# Patient Record
Sex: Male | Born: 1995 | Hispanic: Yes | Marital: Married | Smoking: Current some day smoker
Health system: Southern US, Community
[De-identification: ages and names within clinical notes are randomized; demographics above are authoritative.]

## PROBLEM LIST (undated history)

## (undated) DIAGNOSIS — Z973 Presence of spectacles and contact lenses: Secondary | ICD-10-CM

## (undated) DIAGNOSIS — Z789 Other specified health status: Secondary | ICD-10-CM

## (undated) DIAGNOSIS — J45909 Unspecified asthma, uncomplicated: Secondary | ICD-10-CM

## (undated) DIAGNOSIS — R002 Palpitations: Secondary | ICD-10-CM

## (undated) DIAGNOSIS — U071 COVID-19: Secondary | ICD-10-CM

## (undated) DIAGNOSIS — I10 Essential (primary) hypertension: Secondary | ICD-10-CM

## (undated) DIAGNOSIS — Z8489 Family history of other specified conditions: Secondary | ICD-10-CM

## (undated) DIAGNOSIS — R519 Headache, unspecified: Secondary | ICD-10-CM

## (undated) DIAGNOSIS — F909 Attention-deficit hyperactivity disorder, unspecified type: Secondary | ICD-10-CM

## (undated) DIAGNOSIS — Z0282 Encounter for adoption services: Secondary | ICD-10-CM

## (undated) DIAGNOSIS — F419 Anxiety disorder, unspecified: Secondary | ICD-10-CM

## (undated) DIAGNOSIS — E785 Hyperlipidemia, unspecified: Secondary | ICD-10-CM

## (undated) DIAGNOSIS — F32A Depression, unspecified: Secondary | ICD-10-CM

## (undated) DIAGNOSIS — N200 Calculus of kidney: Secondary | ICD-10-CM

## (undated) DIAGNOSIS — Z87442 Personal history of urinary calculi: Secondary | ICD-10-CM

## (undated) DIAGNOSIS — F319 Bipolar disorder, unspecified: Secondary | ICD-10-CM

## (undated) DIAGNOSIS — K219 Gastro-esophageal reflux disease without esophagitis: Secondary | ICD-10-CM

## (undated) HISTORY — DX: Essential (primary) hypertension: I10

## (undated) HISTORY — PX: LITHOTRIPSY: SUR834

## (undated) HISTORY — DX: Hyperlipidemia, unspecified: E78.5

## (undated) HISTORY — PX: WISDOM TOOTH EXTRACTION: SHX21

---
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
## 2014-04-26 ENCOUNTER — Emergency Department: Payer: Self-pay | Admitting: Emergency Medicine

## 2014-11-01 ENCOUNTER — Inpatient Hospital Stay: Payer: Self-pay | Admitting: Internal Medicine

## 2014-11-01 ENCOUNTER — Ambulatory Visit: Payer: Self-pay | Admitting: Urology

## 2014-11-01 LAB — URINALYSIS, COMPLETE
BACTERIA: NONE SEEN
BILIRUBIN, UR: NEGATIVE
Glucose,UR: NEGATIVE mg/dL (ref 0–75)
Leukocyte Esterase: NEGATIVE
Nitrite: NEGATIVE
Ph: 7 (ref 4.5–8.0)
Protein: 100
RBC,UR: 1529 /HPF (ref 0–5)
SPECIFIC GRAVITY: 1.019 (ref 1.003–1.030)
Squamous Epithelial: NONE SEEN
WBC UR: 6 /HPF (ref 0–5)

## 2014-11-01 LAB — COMPREHENSIVE METABOLIC PANEL
Albumin: 3.9 g/dL (ref 3.8–5.6)
Alkaline Phosphatase: 95 U/L
Anion Gap: 7 (ref 7–16)
BILIRUBIN TOTAL: 0.7 mg/dL (ref 0.2–1.0)
BUN: 11 mg/dL (ref 7–18)
CREATININE: 1.14 mg/dL (ref 0.60–1.30)
Calcium, Total: 8.7 mg/dL — ABNORMAL LOW (ref 9.0–10.7)
Chloride: 107 mmol/L (ref 98–107)
Co2: 27 mmol/L (ref 21–32)
Glucose: 99 mg/dL (ref 65–99)
Osmolality: 281 (ref 275–301)
Potassium: 3.4 mmol/L — ABNORMAL LOW (ref 3.5–5.1)
SGOT(AST): 29 U/L (ref 10–41)
SGPT (ALT): 39 U/L
Sodium: 141 mmol/L (ref 136–145)
Total Protein: 7.1 g/dL (ref 6.4–8.6)

## 2014-11-01 LAB — LIPASE, BLOOD: LIPASE: 82 U/L (ref 73–393)

## 2014-11-01 LAB — CBC
HCT: 42.4 % (ref 40.0–52.0)
HGB: 14.9 g/dL (ref 13.0–18.0)
MCH: 31.8 pg (ref 26.0–34.0)
MCHC: 35.1 g/dL (ref 32.0–36.0)
MCV: 91 fL (ref 80–100)
PLATELETS: 283 10*3/uL (ref 150–440)
RBC: 4.69 10*6/uL (ref 4.40–5.90)
RDW: 13.5 % (ref 11.5–14.5)
WBC: 9.7 10*3/uL (ref 3.8–10.6)

## 2014-11-01 LAB — MONONUCLEOSIS SCREEN: Mono Test: NEGATIVE

## 2014-11-01 LAB — ETHANOL

## 2014-11-03 LAB — CBC WITH DIFFERENTIAL/PLATELET
BASOS ABS: 0.1 10*3/uL (ref 0.0–0.1)
Basophil %: 0.5 %
EOS PCT: 1.5 %
Eosinophil #: 0.2 10*3/uL (ref 0.0–0.7)
HCT: 37.9 % — AB (ref 40.0–52.0)
HGB: 13 g/dL (ref 13.0–18.0)
LYMPHS ABS: 3.2 10*3/uL (ref 1.0–3.6)
Lymphocyte %: 29.1 %
MCH: 32.1 pg (ref 26.0–34.0)
MCHC: 34.3 g/dL (ref 32.0–36.0)
MCV: 94 fL (ref 80–100)
Monocyte #: 1.1 x10 3/mm — ABNORMAL HIGH (ref 0.2–1.0)
Monocyte %: 9.9 %
Neutrophil #: 6.5 10*3/uL (ref 1.4–6.5)
Neutrophil %: 59 %
Platelet: 254 10*3/uL (ref 150–440)
RBC: 4.05 10*6/uL — AB (ref 4.40–5.90)
RDW: 13.5 % (ref 11.5–14.5)
WBC: 11 10*3/uL — ABNORMAL HIGH (ref 3.8–10.6)

## 2014-11-03 LAB — BASIC METABOLIC PANEL
Anion Gap: 5 — ABNORMAL LOW (ref 7–16)
BUN: 4 mg/dL — AB (ref 7–18)
CALCIUM: 7.6 mg/dL — AB (ref 9.0–10.7)
CHLORIDE: 104 mmol/L (ref 98–107)
Co2: 32 mmol/L (ref 21–32)
Creatinine: 1.12 mg/dL (ref 0.60–1.30)
EGFR (Non-African Amer.): >60
Glucose: 78 mg/dL (ref 65–99)
Osmolality: 277 (ref 275–301)
POTASSIUM: 3.4 mmol/L — AB (ref 3.5–5.1)
Sodium: 141 mmol/L (ref 136–145)

## 2014-11-03 LAB — URINALYSIS, COMPLETE
Bilirubin,UR: NEGATIVE
GLUCOSE, UR: NEGATIVE mg/dL (ref 0–75)
KETONE: NEGATIVE
NITRITE: NEGATIVE
PH: 6 (ref 4.5–8.0)
Protein: 30
RBC,UR: 1079 /HPF (ref 0–5)
SPECIFIC GRAVITY: 1.005 (ref 1.003–1.030)
SQUAMOUS EPITHELIAL: NONE SEEN
WBC UR: 9 /HPF (ref 0–5)

## 2014-11-03 LAB — URINE CULTURE

## 2014-11-12 ENCOUNTER — Ambulatory Visit: Payer: Self-pay | Admitting: Urology

## 2014-11-24 ENCOUNTER — Ambulatory Visit: Payer: Self-pay | Admitting: Urology

## 2015-04-17 NOTE — Consult Note (Signed)
Urology Consultation Report: for Consultation:  Obstructing Left Ureteral Stone with Refractory Colic MD: Auburn BilberryShreyang Patel, M.D.MD: Marin OlpJay H. Jadian Karman, M.D. Barbette ReichmannVishwanath Hande, M.D. 19 y.o. WM without prior h/o Urolithiasis and a 4 day h/o progressive left abd pain, associated with nausea, but no emesis.  Pt [facility] ER yesterday.  Abd US revealed a 9mm stone in the proximal left ureteral with mild hydro. A 2nd 3mm stone was also identified in the LLP.  KUB did not reveal any radio-opaque densities, however, a large amount of bowel gas is present.  Denies dysuria, gross hematuria, F/C, diarrhea/constipation. WBC wnL, Ct 1.14, Ca++ 8.7, UA (yellow/turbid) pH 7.0, SG 1.019, 1529 rbc/hpf, 6 wbc/hpf, no bacteria. as per the detailed H&P by Dr. Lacie ScottsShreyang H. Patel, dated 11/01/2014. T (97.35F), P (109), RR 20, BP (144/91), O2 Sat (99% on RA)WDWN WM in NADWarm/Dry, no lesions about the head/neckNC/AT, EOMI, Anictericno masses, no bruitsCTA, nL Respiratory EffortRRR (mild tachycardia) without M/G/R, 2+ pulses b/Ltender b/L lower quadrants without guarding/rebound, no CVAT, no palpable masses/organomegalydeferredno edemaNon-focalSleepy, but arousable and appropriate, cooperative. Oriented x4. per HPI  left proximal ureteral stone - ?non-radio-opaque, large (unlikely to pass spontaneously), with refractory colic despite parenteral narcotics  Cysto with Left JJ Ureteral Stent placementreassess whether the stone is radio-opaque intra-op with fluoroscopyunderstand that multiple, staged procedures may be required to clear the large stone burdenunderstand that the stone may be impacted preventing retrograde access, thereby necessitating percutaneous nephrostomy placementunderstand that follow-up and definitive stone treatment will be [facility]  Electronic Signatures: Marin OlpKim, Sharvi Mooneyhan H (MD)  (Signed on 08-Nov-15 14:29)  Authored  Last Updated: 08-Nov-15 14:29 by Marin OlpKim, Destin Kittler H (MD)

## 2015-04-17 NOTE — Discharge Summary (Signed)
PATIENT NAME:  Tom Branch MR#:  [id number] DATE OF BIRTH:  [birth year]  DATE OF ADMISSION:  11/01/2014 DATE OF DISCHARGE:  11/03/2014  DIAGNOSES AT TIME OF DISCHARGE:  1.  Left ureteral lithiasis with bladder spasm and pyelonephrosis.   2.  Temporary left ureteral catheterization with left JJ ureteral stent placement.   CHIEF COMPLAINT: Left lower quadrant abdominal pain.   HISTORY OF PRESENT ILLNESS: Tom Branch is a 19 year old male with a history of renal stones and ADHD who presented to the ED complaining of left upper quadrant abdominal pain, which he described as sharp in nature. The patient stated that subsequently the pain moved to the left lower quadrant area and also the back. An ultrasound of the abdomen showed a 9 mm obstructing stone within the proximal left ureter with secondary mild left hydronephrosis. The patient was seen initially by urologist Dr. Maudie Mercury and underwent cystoscopy with temporary left ureteral catheterization and selective urine collection from the left renal collecting system and left JJ ureteral stent placement.    PAST MEDICAL HISTORY:  Significant for ADHD, environmental and food allergies.  Please see H and P for other details.   HOSPITAL COURSE: The patient was in significant pain and received intravenous morphine and sometimes Dilaudid. He was continued on his Concerta. He was also seen in consultation subsequently by Dr. Yves Dill who felt that the patient should be well hydrated and was prescribed Levsin for bladder spasm and Nucynta for pain, stool softeners also prescribed and the patient will follow up with Dr. Yves Dill as an outpatient. The patient was discharged on the following medications.    DISCHARGE MEDICATIONS: Cipro 500 mg p.o. b.i.d. for 10 days, docusate sodium 200 mg p.o. b.i.d., Levsin sublingual 2 tablets q. 4 hours p.r.n. for bladder spasm, ondansetron 8 mg disintegrating every 6 hours p.r.n. for nausea and vomiting, methylphenidate 36 mg in 24  hours extended release 2 tablets once a day, and Adrenaclick 2 pack 0.3 mg injectable kit as directed.   DISCHARGE INSTRUCTIONS:  The patient will follow up with Dr. Yves Dill as an outpatient and also with me, Dr. Ginette Pitman, in 2-3 weeks time. He has been advised to call back with any questions or concerns. He was stable at the time of discharge.   TOTAL TIME SPENT DISCHARGING THIS PATIENT: 35 minutes.     ____________________________ Tracie Harrier, MD vh:bu D: 11/04/2014 12:49:14 ET T: 11/04/2014 13:17:42 ET JOB#: [id number]  cc: Tracie Harrier, MD, <Dictator> Tracie Harrier MD ELECTRONICALLY SIGNED 11/24/2014 17:47

## 2015-04-17 NOTE — Op Note (Signed)
PATIENT NAME:  Tom Branch, Tom Branch MR#:  [id number] DATE OF BIRTH:  [birth year]  DATE OF PROCEDURE:  11/24/2014  PREOPERATIVE DIAGNOSIS: Ureterolithiasis.   POSTOPERATIVE DIAGNOSIS: Ureterolithiasis.   PROCEDURES: Cystoscopy with stent removal.   SURGEON: Anola GurneyMichael Mostyn Varnell, M.D.   ANESTHETIST: Yves DillPaul Carroll, MD, Suszanne ConnersMichael R. Evelene CroonWolff, MD.   ANESTHETIC METHOD: General per Dr. Noralyn Pickarroll, local per Dr. Evelene CroonWolff.   INDICATIONS: See the dictated history and physical. After informed consent, the patient requests the above procedure.   OPERATIVE SUMMARY: After adequate general anesthesia had been obtained, the patient was placed into dorsal lithotomy position and the perineum was prepped and draped in the usual fashion. The 21 French cystoscope was coupled with the camera and then visually advanced into the bladder. The left ureteral stent was identified. Right orifice was identified and had clear efflux. No bladder mucosal lesions were identified. At this point, the alligator graspers advanced through the scope and the stent engaged and removed. The cystoscope was removed as well, 10 mL of viscous Xylocaine was instilled within the urethra and the bladder. AB and O suppository was placed. The procedure was then terminated and patient was transferred to the recovery room in stable condition.    ____________________________ Suszanne ConnersMichael R. Evelene CroonWolff, MD mrw:DT D: 11/24/2014 13:29:07 ET T: 11/24/2014 14:46:53 ET JOB#: [id number]  cc: Suszanne ConnersMichael R. Evelene CroonWolff, MD, <Dictator> Orson ApeMICHAEL R Myndi Wamble MD ELECTRONICALLY SIGNED 11/26/2014 9:28

## 2015-04-17 NOTE — Consult Note (Signed)
Brief Consult Note: Diagnosis: Left ureterolithiasis. Bladder spasms due to stent. Pyonephrosis.   Patient was seen by consultant.   Consult note dictated.   Recommend to proceed with surgery or procedure.   Recommend further assessment or treatment.   Orders entered.   Discussed with Attending MD.   Comments: Keep well hydrated. Levsin for spasms. Nucynta for pain. Stool softeners. Stable from urological poit for discharge. Follow-up in the office on monday and I will scdule for lithotripsy next thursday.  Electronic Signatures: Orson ApeWolff, Michael R (MD)  (Signed [phone]-Nov-15 12:08)  Authored: Brief Consult Note   Last Updated: 10-Nov-15 12:08 by Orson ApeWolff, Michael R (MD)

## 2015-04-17 NOTE — Consult Note (Signed)
PATIENT NAME:  Tom Branch, Tom Branch MR#:  [id number] DATE OF BIRTH:  [birth year]  DATE OF CONSULTATION:  11/03/2014  REFERRING PHYSICIAN:   CONSULTING PHYSICIAN:  Suszanne ConnersMichael R. Evelene CroonWolff, MD  REQUESTING PHYSICIAN:  Dr. Marcello FennelHande.    REASON FOR CONSULTATION: Kidney stone pain.   HISTORY OF PRESENT ILLNESS: Tom Branch is a 19 year old Caucasian male with a left proximal ureteral stone who underwent stent placement per Dr. Selena BattenKim on 11/08. Apparently he continues to have bladder spasms and flank pain. He is currently on Cipro for pyelonephrosis. This is his first stone episode.   ALLERGIES: NO DRUG ALLERGIES. HE HAS MULTIPLE ENVIRONMENTAL AND FOOD ALLERGIES INCLUDING MEAT, GRASS, AND DUST.   CHRONIC HOME MEDICATIONS: Included hydroxyzine, hydrocortisone cream, Concerta, Adrenaclick for allergy attacks.    PAST SURGICAL HISTORY: No previous surgical procedures.   SOCIAL HISTORY: The patient denied tobacco or alcohol use.   FAMILY HISTORY: Is not available because he is adopted.   PAST AND CURRENT MEDICAL CONDITIONS:  1.  ADHD.   2.  Environmental allergies.   REVIEW OF SYSTEMS: The patient denied prior history of kidney stones, hematuria, or urinary tract infections.   PHYSICAL EXAMINATION:  GENERAL: A well-nourished white male in no acute distress.  HEENT: Sclerae were clear. Pupils were equally round, reactive to light and accommodation.  NECK: Supple. No palpable cervical adenopathy.  LUNGS: Clear to auscultation.  CARDIOVASCULAR: Regular rhythm and rate without audible murmurs.  ABDOMEN: Soft, nontender abdomen.  GENITOURINARY AND RECTAL: Deferred.  NEUROMUSCULAR: Grossly intact. The patient was alert and oriented x 3.   KUB today indicated that the stent was in good position. He had a large amount of overlying bowel gas and stool and stone was not clearly seen.   PERTINENT LABORATORY STUDIES: Include hematocrit 37.9% with a white cell count of 11,000. BUN was 4 and creatinine 1.12.    IMPRESSION:  1.  Left ureterolithiasis status post stent placement.  2.  Pyelonephrosis status post stent placement.  3.  Bladder spasms and renal colic.   PLAN:  1.  Encouraged the patient drink plenty of fluids as this would certainly help his bladder spasms.   2.  Levsin sublingual 2 every 4 hours as needed for spasm.  3.  Nucynta 50-100 mg every 6 hours need for pain.   4.  Zofran 8 mg sublingual every 4-6 hours need for nausea and vomiting.  5.  Complete 10 day course of Cipro and then follow up in the office next week. We will schedule lithotripsy after he has completed the course of antibiotics.   6.  Colace 200 mg b.i.d.    ____________________________ Suszanne ConnersMichael R. Evelene CroonWolff, MD mrw:bu D: 11/03/2014 13:05:01 ET T: 11/03/2014 13:23:43 ET JOB#: [id number]  cc: Suszanne ConnersMichael R. Evelene CroonWolff, MD, <Dictator> Orson ApeMICHAEL R Douglas Smolinsky MD ELECTRONICALLY SIGNED 11/03/2014 15:25

## 2015-04-17 NOTE — H&P (Signed)
PATIENT NAME:  Tom Branch, Tom Branch MR#:  [id number] DATE OF BIRTH:  February 25, 1996  DATE OF ADMISSION:  11/01/2014  PRIMARY CARE PROVIDER: Tracie Harrier, M.D.   EMERGENCY DEPARTMENT REFERRING PHYSICIAN: Gretchen Short. Beather Arbour, M.D.   REASON FOR ADMISSION: Left lower quadrant abdominal pain.   HISTORY OF PRESENT ILLNESS: The patient is a 19 year old, white male, with no history of renal stones in the past. Has a history of ADHD, environmental allergies, who started having left upper quadrant pain, sharp in nature, last Wednesday. The pain persisted for a few hours and then went away, and then since yesterday, he is having severe pain. The patient came to the ED with sharp pain in the left lower quadrant and also in the back of his abdomen. The patient underwent evaluation, including ultrasound of the abdomen which showed a 9 mm obstructive stone within the proximal left ureter with secondary mild left hydronephrosis was noted. Dr. Lurline Hare discussed the case with Dr. Maudie Mercury of urology, who will see the patient. The patient, at this point, is still having pain and is having some nausea. Denies any chest pains, palpitations, no fevers, no chills, no difficulty with urination.   PAST MEDICAL HISTORY: Significant for history of ADHD, MULTIPLE ENVIRONMENTAL AND FOOD ALLERGIES.   ALLERGIES: RED MEAT, GRASS, DUST.   MEDICATIONS AT HOME: He is on hydroxyzine 25 mg 1 tablet 4 times a day as needed, hydrocortisone 1% topically applied to affected area as needed, EpiPen as needed, Concerta 36 mg 2 tablets daily, Adrenaclick kit as needed.   SOCIAL HISTORY: Does not smoke. Does not drink, no drugs.   FAMILY HISTORY: He is adopted and does not know about his original family.   REVIEW OF SYSTEMS:  CONSTITUTIONAL: Denies any fevers, chills. No weight loss. No weight gain.  HEENT: Denies any blurred vision, double vision. No cataracts, no glaucoma.  ENT: Denies any nasal congestion. Does have environmental allergies, denies  any epistaxis. No difficulty with swallowing. Ears: Denies any ringing in the ears. No difficulty with hearing.  CARDIOVASCULAR: Denies any chest pain, palpitations, syncope.  PULMONARY: Denies any cough, wheezing, hemoptysis. No COPD.  GASTROINTESTINAL: Complains of left lower quadrant abdominal pain. No nausea, vomiting or diarrhea. No hematemesis. No hematochezia.  GENITOURINARY: Denies any frequency, urgency or< hematuria.  ENDOCRINE: Denies any polyuria or polydipsia. No diabetes.  SKIN: Denies any rash, denies any lymph node enlargement.  MUSCULOSKELETAL: Denies any joint pains or gout.  NEUROLOGIC: No CVA, TIA or seizures. Does have ADHD.  PSYCHIATRIC: Does have ADHD. No anxiety or depression.   PHYSICAL EXAMINATION:  VITAL SIGNS: Temperature 97.5, pulse 88, respirations 20, blood pressure 140/84.  GENERAL: The patient is a well-developed, well-nourished male in no acute distress.  HEENT: Head atraumatic, normocephalic. Pupils equally round, reactive to light and accommodation. There is no conjunctival pallor. No scleral icterus. Nasal examination shows no drainage or ulceration. Ears: There is no erythema or drainage. Oropharynx: Dry without any exudate.  NECK: Supple without any JVD. No thyromegaly.  CARDIOVASCULAR: Regular rate and rhythm. No murmurs, rubs, clicks, or gallops.  LUNGS: Clear to auscultation bilaterally without any rales, rhonchi, wheezing.  ABDOMEN: There is a left lower quadrant tenderness. No guarding, no rebound.  EXTREMITIES: No clubbing, cyanosis, or edema.  SKIN: No rash.  LYMPH NODES: Nonpalpable.  VASCULAR: Good DP, PT pulses.  PSYCHIATRIC: Not anxious or depressed.  NEUROLOGIC: Awake, alert, oriented x 3. No focal deficits.  MUSCULOSKELETAL: There is no erythema or swelling.  LYMPHATICS: Lymph nodes nonpalpable.  EVALUATIONS: Ultrasound of the abdomen shows a 9 mm obstructive stone within the proximal left ureter with secondary mild left hydronephrosis.  Additional no-obstructive stone measuring up to 3 mm within the lower pole of the left kidney.   PA and lateral chest x-ray shows no acute cardiopulmonary processes.   KUB was negative.   LABORATORY DATA: Glucose 99, BUN 11, creatinine 1.14, sodium 141, potassium 3.4, chloride 107, CO2 of 27. LFTs are normal. WBC 9.7, hemoglobin 14.9, platelet count 283,000, lipase 82. Mono test negative.   ASSESSMENT AND PLAN: 1. The patient is a 19 year old, white male, with history of attention deficit/hyperactivity disorder, presents with severe left lower quadrant pain,left lower quadrant abdominal pain due to obstructive stone. At this time, we will give him IV fluids. We will control his pain with morphine. The Emergency Department M.D. has spoken to Dr. Maudie Mercury of urology, who will see the patient. We will place him on empiric antibiotics. Further intervention per urology. 2. Attention deficit/hyperactivity disorder. We will continue Concerta.  3. Miscellaneous. The patient will be on sequential compression devices for deep vein thrombosis prophylaxis in light of the possible need for a procedure.   TIME SPENT: 45 minutes.     ____________________________ Lafonda Mosses Posey Pronto, MD shp:JT D: 11/01/2014 08:39:24 ET T: 11/01/2014 10:20:07 ET JOB#: [id number]  cc: Kolette Vey H. Posey Pronto, MD, <Dictator> Alric Seton MD ELECTRONICALLY SIGNED 11/11/2014 19:35

## 2015-04-17 NOTE — H&P (Signed)
PATIENT NAME:  Tom Branch, [name] MR#:  [id number] DATE OF BIRTH:  1996-07-07  DATE OF ADMISSION:  11/24/2014  Same day surgery scheduled December 1.   CHIEF COMPLAINT: Kidney stone.   HISTORY OF PRESENT ILLNESS: Mr. Tom Branch is a 19 year old Caucasian male with a left proximal ureteral stone, who underwent stent placement November 8 followed by lithotripsy  November 19. Followup in the office on November 25 indicated that he was stone free. He comes in now for cystoscopy with stent retrieval.   ALLERGIES: No drug allergies.   CURRENT MEDICATIONS: Include hydroxyzine, hydrocortisone cream, Concerta, Adrenaclick for allergic attacks.   PAST SURGICAL HISTORY: No previous surgical procedures other than the above.   SOCIAL HISTORY: The patient denied tobacco or alcohol use.   FAMILY HISTORY: Not available because he is adopted.   PAST AND CURRENT MEDICAL CONDITIONS:  1.  ADHD.  2.  Environmental allergies.   REVIEW OF SYSTEMS: The patient denied urinary tract infections or prior history of stone disease.   PHYSICAL EXAMINATION: GENERAL: A well-nourished white male in no distress.  HEENT: Sclerae were clear. Pupils were equally round and reactive to light and accommodation. Extraocular movements were intact.  NECK: Supple. No palpable cervical adenopathy.  LUNGS: Clear to auscultation.  CARDIOVASCULAR: Regular rhythm and rate without audible murmurs.  ABDOMEN: Soft, nontender abdomen.  GENITOURINARY AND RECTAL: Deferred.  NEUROMUSCULAR: Grossly intact.   IMPRESSION: Right ureterolithiasis status post stent placement and lithotripsy.   PLAN: Cystoscopy with stent retrieval .    ____________________________ Suszanne ConnersMichael R. Evelene CroonWolff, MD mrw:at D: 11/18/2014 14:48:37 ET T: 11/18/2014 15:40:58 ET JOB#: [id number]  cc: Suszanne ConnersMichael R. Evelene CroonWolff, MD, <Dictator> Orson ApeMICHAEL R Meria Crilly MD ELECTRONICALLY SIGNED 11/24/2014 8:58

## 2015-04-21 NOTE — Op Note (Signed)
PATIENT NAME:  Tom Branch, Tom Branch MR#:  [id number] DATE OF BIRTH:  03/12/1996  DATE OF PROCEDURE:  11/01/2014  PREOPERATIVE DIAGNOSIS: Left obstructing proximal ureteral calculus.  POSTOPERATIVE DIAGNOSES:  1.  Left obstructing proximal ureteral calculus. 2.  Left pyonephrosis.   PROCEDURES: 1.  Cystourethroscopy with temporary left ureteral catheterization.  2.  Selective urine collection from the left renal collecting system.  3.  Left double-J ureteral stent placement.   SURGEON: Marin OlpJay H. Sherle Mello, MD   ANESTHESIA:  General.  ESTIMATED BLOOD LOSS: None.   COMPLICATIONS: None.   DRAINS: A 4.8 French x 26 cm double-J ureteral stent on the left (no string).   PATHOLOGY SPECIMENS: Urine from the left renal collecting system for Gram stain and routine cultures.   DESCRIPTION OF PROCEDURE: The patient was brought to the cystoscopy suite and after general anesthesia was established, was placed in the dorsal lithotomy position and prepped and draped in the usual sterile manner. A 21 French cystoscope sheath was then inserted with the 30 degrees lens. Urethra was smooth without lesion or abnormality. The prostate was nonocclusive. Within the bladder, there were single ureteral orifices bilaterally. There were no intravesical stones. There were no bladder diverticula. There were no bladder mucosal lesions or tumors.   A 0.035 angled tip Sensor wire was then inserted into the left ureteral orifice and advanced into the region of the left renal collecting system under fluoroscopic guidance. There were no radiopaque densities appreciated overlying the course of the left kidney or ureter. However, there was a large amount of bowel gas appreciated.   After passage of the guidewire, copious amounts of cloudy, purulent, blood-tinged urine was appreciated consistent with the release of a pyonephrosis. As such, a 5 [place]French open-ended ureteral catheter was advanced over the Sensor wire and into the region of the  left renal pelvis under fluoroscopic guidance. The Sensor wire was then withdrawn and a good hydronephrotic drip appreciated. Urine was collected from the left renal collecting system through the open-ended ureteral catheter and was sent for Gram stain with routine cultures and sensitivities.   The glidewire was then replaced through the open-ended ureteral catheter into the left renal collecting system. The open-ended ureteral catheter was then withdrawn. A 4.8 French x 26 cm double-J ureteral stent was then advanced over the Sensor wire and once a good curl was obtained in the renal pelvis, the guidewire was removed producing a good curl in the bladder. Excellent efflux of cloudy purulent urine was noted through and around the ureteral stent after placement. This was consistent with, again, good decompression of the pyonephrosis. The string had been removed from the distal aspect of the ureteral stent prior to placement.   The bladder was then drained through the cystoscope sheath, which was then withdrawn. The patient was then taken out of the dorsal lithotomy position and extubated and transferred to the postanesthesia care unit in stable condition having tolerated the procedure well.    ____________________________ Marin OlpJay H. Godfrey Tritschler, MD jhk:LT D: 11/01/2014 19:49:16 ET T: 11/01/2014 20:18:03 ET JOB#: [id number]  cc: Marin OlpJay H. Khari Lett, MD, <Dictator> Marin OlpJAY H Tomeka Kantner MD ELECTRONICALLY SIGNED 12/30/2014 1:32

## 2015-04-28 DIAGNOSIS — J45909 Unspecified asthma, uncomplicated: Secondary | ICD-10-CM | POA: Insufficient documentation

## 2015-09-15 ENCOUNTER — Encounter: Payer: Self-pay | Admitting: Emergency Medicine

## 2015-09-15 ENCOUNTER — Emergency Department: Payer: Worker's Compensation

## 2015-09-15 ENCOUNTER — Emergency Department
Admission: EM | Admit: 2015-09-15 | Discharge: 2015-09-15 | Disposition: A | Discharge status: Home / Self Care | Payer: Worker's Compensation | Attending: Emergency Medicine | Admitting: Emergency Medicine

## 2015-09-15 DIAGNOSIS — Y9289 Other specified places as the place of occurrence of the external cause: Secondary | ICD-10-CM | POA: Insufficient documentation

## 2015-09-15 DIAGNOSIS — S6722XA Crushing injury of left hand, initial encounter: Secondary | ICD-10-CM

## 2015-09-15 DIAGNOSIS — Y990 Civilian activity done for income or pay: Secondary | ICD-10-CM | POA: Diagnosis not present

## 2015-09-15 DIAGNOSIS — W231XXA Caught, crushed, jammed, or pinched between stationary objects, initial encounter: Secondary | ICD-10-CM | POA: Diagnosis not present

## 2015-09-15 DIAGNOSIS — S67193A Crushing injury of left middle finger, initial encounter: Secondary | ICD-10-CM | POA: Insufficient documentation

## 2015-09-15 DIAGNOSIS — S61303A Unspecified open wound of left middle finger with damage to nail, initial encounter: Secondary | ICD-10-CM | POA: Insufficient documentation

## 2015-09-15 DIAGNOSIS — Y9389 Activity, other specified: Secondary | ICD-10-CM | POA: Diagnosis not present

## 2015-09-15 DIAGNOSIS — Z79899 Other long term (current) drug therapy: Secondary | ICD-10-CM | POA: Insufficient documentation

## 2015-09-15 DIAGNOSIS — S61309A Unspecified open wound of unspecified finger with damage to nail, initial encounter: Secondary | ICD-10-CM

## 2015-09-15 DIAGNOSIS — S61211A Laceration without foreign body of left index finger without damage to nail, initial encounter: Secondary | ICD-10-CM | POA: Diagnosis present

## 2015-09-15 MED ORDER — ACETAMINOPHEN-CODEINE #3 300-30 MG PO TABS
1.0000 | ORAL_TABLET | Freq: Four times a day (QID) | ORAL | Status: DC | PRN
Start: 2015-09-15 — End: 2016-12-08

## 2015-09-15 MED ORDER — LIDOCAINE HCL (PF) 1 % IJ SOLN
5.0000 mL | Freq: Once | INTRAMUSCULAR | Status: AC
  Administered 2015-09-15: 5 mL
  Filled 2015-09-15: qty 5

## 2015-09-15 NOTE — ED Notes (Signed)
Pt presents to ed with laceration to left index finger, happened at work this am.

## 2015-09-15 NOTE — ED Provider Notes (Signed)
[facility] Emergency Department Provider Note ____________________________________________  Time seen: 1225  I have reviewed the triage vital signs and the nursing notes.  HISTORY  Chief Complaint  Laceration  HPI Tom Branch is a 19 y.o. male reports to the ED for evaluation of an injury to his left index finger while at work today. He describes his left index finger got caught between the pin of a hydraulic lift it piece of equipment. This caused disruption of his left index fingernail from the base. He presents with a partial nail avulsion. He denies any other injury at this time. He reports an up-to-date tetanus status.  History reviewed. No pertinent past medical history.  There are no active problems to display for this patient.  History reviewed. No pertinent past surgical history.  Current Outpatient Rx  Name  Route  Sig  Dispense  Refill  . methylphenidate 36 MG PO CR tablet   Oral   Take 36 mg by mouth daily.         Marland Kitchen acetaminophen-codeine (TYLENOL #3) 300-30 MG per tablet   Oral   Take 1 tablet by mouth every 6 (six) hours as needed for moderate pain.   10 tablet   0    Allergies Review of patient's allergies indicates no known allergies.  No family history on file.  Social History Social History  Substance Use Topics  . Smoking status: Never Smoker   . Smokeless tobacco: None  . Alcohol Use: No   Review of Systems  Constitutional: Negative for fever. Eyes: Negative for visual changes. ENT: Negative for sore throat. Cardiovascular: Negative for chest pain. Respiratory: Negative for shortness of breath. Gastrointestinal: Negative for abdominal pain, vomiting and diarrhea. Genitourinary: Negative for dysuria. Musculoskeletal: Negative for back pain. Left index finger nail injury. Skin: Negative for rash. Neurological: Negative for headaches, focal weakness or  numbness. ____________________________________________  PHYSICAL EXAM:  VITAL SIGNS: ED Triage Vitals  Enc Vitals Group     BP 09/15/15 1147 131/68 mmHg     Pulse Rate 09/15/15 1147 71     Resp 09/15/15 1147 20     Temp 09/15/15 1147 98.2 F (36.8 C)     Temp Source 09/15/15 1147 Oral     SpO2 09/15/15 1147 100 %     Weight 09/15/15 1147 147 lb (66.679 kg)     Height 09/15/15 1147  (1.702 m)     Head Cir --      Peak Flow --      Pain Score 09/15/15 1148 10     Pain Loc --      Pain Edu? --      Excl. in GC? --    Constitutional: Alert and oriented. Well appearing and in no distress. Eyes: Conjunctivae are normal. PERRL. Normal extraocular movements. ENT   Head: Normocephalic and atraumatic.   Nose: No congestion/rhinorrhea.   Mouth/Throat: Mucous membranes are moist.   Neck: Supple. No thyromegaly. Hematological/Lymphatic/Immunological: No cervical lymphadenopathy. Cardiovascular: Normal rate, regular rhythm.  Respiratory: Normal respiratory effort. No wheezes/rales/rhonchi. Gastrointestinal: Soft and nontender. No distention. Musculoskeletal: Left index finger with an exposed partially avulsed proximal fingernail and a cuticle laceration. Patient with normal DIP flexion and extension. No active bleeding is appreciated. Nontender with normal range of motion in all extremities.  Neurologic: Normal gross sensation. Cranial nerves II through XII grossly intact. Normal intrinsic and opposition testing. Normal gait without ataxia. Normal speech and language. No gross focal neurologic deficits are appreciated. Skin:  Skin is warm, dry and intact. No rash noted. Psychiatric: Mood and affect are normal. Patient exhibits appropriate insight and judgment. ____________________________________________   RADIOLOGY  Left Index finger IMPRESSION: Radiopaque foreign body over the distal aspect of the nail bed versus small avulsion fracture of thCharlesetta Ivoryalanx.  I,  Malina Geers V Bacon, personally viewed and evaluated these images (plain radiographs) as part of my medical decision making.  ____________________________________________  PROCEDURES  LACERATION REPAIR Performed by: Lissa Hoard Authorized by: Lissa Hoard Consent: Verbal consent obtained. Risks and benefits: risks, benefits and alternatives were discussed Consent given by: patient Patient identity confirmed: provided demographic data Prepped and Draped in normal sterile fashion Wound explored  Laceration Location: left index finger  Laceration Length: 1 cm across the cuticle  No Foreign Bodies seen or palpated  Anesthesia: digital infiltration  Local anesthetic: lidocaine 1% w/o epinephrine  Anesthetic total: 3 ml  Irrigation method: syringe Amount of cleaning: standard  Skin closure: none  Number of sutures: none  Technique: Proximal nail edge cut to allow replacement of partially attached nail down over the nail bed.  Patient tolerance: Patient unable to tolerate any attempt to reduce nail base under the cuticle.  ____________________________________________  INITIAL IMPRESSION / ASSESSMENT AND PLAN / ED COURSE  Left index finger crush injury without radiologic evidence of bony injury. Partial nail avulsion with reduction of exposed nail edge. Patient given instructions on wound care. He is to return to the ED as needed for wound check. Is also given a prescription for Tylenol #3 for pain as needed. ____________________________________________  FINAL CLINICAL IMPRESSION(S) / ED DIAGNOSES  Final diagnoses:  Crushing injury of finger of left hand, initial encounter  Nail avulsion, finger, initial encounter      Lissa Hoard, PA-C 09/15/15 1539  Emily Filbert, MD 09/16/15 [phone]

## 2015-09-15 NOTE — ED Notes (Signed)
walked urine drug screen to lab.

## 2015-09-15 NOTE — ED Notes (Signed)
Laceration to left index finger by a crank shaft.

## 2015-09-15 NOTE — Discharge Instructions (Signed)
Crush Injury, Fingers or Toes A crush injury to the fingers or toes means the tissues have been damaged by being squeezed (compressed). There will be bleeding into the tissues and swelling. Often, blood will collect under the skin. When this happens, the skin on the finger often dies and may slough off (shed) 1 week to 10 days later. Usually, new skin is growing underneath. If the injury has been too severe and the tissue does not survive, the damaged tissue may begin to turn black over several days.  Wounds which occur because of the crushing may be stitched (sutured) shut. However, crush injuries are more likely to become infected than other injuries.These wounds may not be closed as tightly as other types of cuts to prevent infection. Nails involved are often lost. These usually grow back over several weeks.  DIAGNOSIS X-rays may be taken to see if there is any injury to the bones. TREATMENT Broken bones (fractures) may be treated with splinting, depending on the fracture. Often, no treatment is required for fractures of the last bone in the fingers or toes. HOME CARE INSTRUCTIONS   The crushed part should be raised (elevated) above the heart or center of the chest as much as possible for the first several days or as directed. This helps with pain and lessens swelling. Less swelling increases the chances that the crushed part will survive.  Put ice on the injured area.  Put ice in a plastic bag.  Place a towel between your skin and the bag.  Leave the ice on for 15-20 minutes, 03-04 times a day for the first 2 days.  Only take over-the-counter or prescription medicines for pain, discomfort, or fever as directed by your caregiver.  Use your injured part only as directed.  Change your bandages (dressings) as directed.  Keep all follow-up appointments as directed by your caregiver. Not keeping your appointment could result in a chronic or permanent injury, pain, and disability. If there is  any problem keeping the appointment, you must call to reschedule. SEEK IMMEDIATE MEDICAL CARE IF:   There is redness, swelling, or increasing pain in the wound area.  Pus is coming from the wound.  You have a fever.  You notice a bad smell coming from the wound or dressing.  The edges of the wound do not stay together after the sutures have been removed.  You are unable to move the injured finger or toe. MAKE SURE YOU:   Understand these instructions.  Will watch your condition.  Will get help right away if you are not doing well or get worse. Document Released: 12/11/2005 Document Revised: 03/04/2012 Document Reviewed: 04/28/2011 [facility] Patient Information 2015 [place], [place]. This information is not intended to replace advice given to you by your health care provider. Make sure you discuss any questions you have with your health care provider.  Nail Avulsion Injury Nail avulsion means that you have lost the whole, or part of a nail. The nail will usually grow back in 2 to 6 months. If your injury damaged the growth center of the nail, the nail may be deformed, split, or not stuck to the nail bed. Sometimes the avulsed nail is stitched back in place. This provides temporary protection to the nail bed until the new nail grows in.  HOME CARE INSTRUCTIONS   Raise (elevate) your injury as much as possible.  Protect the injury and cover it with bandages (dressings) or splints as instructed.  Change dressings as instructed. SEEK MEDICAL  CARE IF:   There is increasing pain, redness, or swelling.  You cannot move your fingers or toes. Document Released: 01/18/2005 Document Revised: 03/04/2012 Document Reviewed: 11/12/2009 [facility] Patient Information 2015 [place], [place]. This information is not intended to replace advice given to you by your health care provider. Make sure you discuss any questions you have with your health care provider.  Nail Bed Injury The nail bed is the  soft tissue under a fingernail or toenail. Various types of injuries can occur at the nail bed. These may involve bruising or bleeding under the nail, cuts in the nail or nail bed, or loss of the nail or a part of it. It can take several months for a damaged nail to regrow. In some cases, the nail may not grow back normally. HOME CARE  Raise (elevate) the injured part to lessen pain and puffiness (swelling).   For an injured toenail, lie with your leg on pillows. Avoid walking or letting your leg dangle. When you walk, wear an open-toe shoe.   For an injured fingernail, keep your hand above the level of your heart. Use pillows on a table or on the arm of your chair while sitting. Use pillows on your bed while sleeping.   Use bandages or splints as told by your doctor.   Keep your bandage (dressing) clean and dry. Change your bandage as told by your doctor.   Only take medicine as told by your doctor.   See your doctor as needed. GET HELP RIGHT AWAY IF:   You have more pain, leaking fluid (drainage), or bleeding in the injured area.   You have redness, soreness, and puffiness (inflammation) in the injured area.  You have a fever or lasting symptoms for more than 2-3 days.  You have a fever and your symptoms suddenly get worse.  You have puffiness that spreads from your finger into your hand or from your toe into your foot.  MAKE SURE YOU:  Understand these instructions.  Will watch this condition.  Will get help right away if you are not doing well or get worse. Document Released: 11/29/2009 Document Revised: 04/07/2013 Document Reviewed: 01/02/2013 [facility] Patient Information 2015 [place], [place]. This information is not intended to replace advice given to you by your health care provider. Make sure you discuss any questions you have with your health care provider.  Keep the wound clean, dry, and covered. Take the pain medicine as needed. Follow-up with your provider as  needed.  Return as needed for wound checks.

## 2016-02-09 ENCOUNTER — Emergency Department
Admission: EM | Admit: 2016-02-09 | Discharge: 2016-02-09 | Disposition: A | Discharge status: Home / Self Care | Payer: Commercial Managed Care - HMO | Attending: Emergency Medicine | Admitting: Emergency Medicine

## 2016-02-09 ENCOUNTER — Encounter: Payer: Self-pay | Admitting: *Deleted

## 2016-02-09 DIAGNOSIS — L500 Allergic urticaria: Secondary | ICD-10-CM | POA: Diagnosis not present

## 2016-02-09 DIAGNOSIS — Y9389 Activity, other specified: Secondary | ICD-10-CM | POA: Insufficient documentation

## 2016-02-09 DIAGNOSIS — T781XXA Other adverse food reactions, not elsewhere classified, initial encounter: Secondary | ICD-10-CM | POA: Insufficient documentation

## 2016-02-09 DIAGNOSIS — Y998 Other external cause status: Secondary | ICD-10-CM | POA: Insufficient documentation

## 2016-02-09 DIAGNOSIS — T7840XA Allergy, unspecified, initial encounter: Secondary | ICD-10-CM | POA: Diagnosis present

## 2016-02-09 DIAGNOSIS — L509 Urticaria, unspecified: Secondary | ICD-10-CM

## 2016-02-09 DIAGNOSIS — X58XXXA Exposure to other specified factors, initial encounter: Secondary | ICD-10-CM | POA: Insufficient documentation

## 2016-02-09 DIAGNOSIS — R0789 Other chest pain: Secondary | ICD-10-CM | POA: Insufficient documentation

## 2016-02-09 DIAGNOSIS — Y9289 Other specified places as the place of occurrence of the external cause: Secondary | ICD-10-CM | POA: Insufficient documentation

## 2016-02-09 MED ORDER — PREDNISONE 20 MG PO TABS
ORAL_TABLET | ORAL | Status: AC
  Filled 2016-02-09: qty 3

## 2016-02-09 MED ORDER — PREDNISONE 20 MG PO TABS
60.0000 mg | ORAL_TABLET | Freq: Once | ORAL | Status: AC
  Administered 2016-02-09: 60 mg via ORAL

## 2016-02-09 MED ORDER — EPINEPHRINE 0.3 MG/0.3ML IJ SOAJ: 0.3000 mg | Freq: Once | INTRAMUSCULAR | Status: DC

## 2016-02-09 MED ORDER — FAMOTIDINE 40 MG PO TABS: 40.0000 mg | ORAL_TABLET | Freq: Every evening | ORAL | Status: DC

## 2016-02-09 MED ORDER — FAMOTIDINE 20 MG PO TABS
40.0000 mg | ORAL_TABLET | Freq: Once | ORAL | Status: AC
  Administered 2016-02-09: 40 mg via ORAL

## 2016-02-09 MED ORDER — FAMOTIDINE 20 MG PO TABS
ORAL_TABLET | ORAL | Status: AC
  Filled 2016-02-09: qty 2

## 2016-02-09 MED ORDER — PREDNISONE 20 MG PO TABS
60.0000 mg | ORAL_TABLET | Freq: Every day | ORAL | Status: DC
Start: 2016-02-09 — End: 2016-12-08

## 2016-02-09 NOTE — ED Notes (Signed)
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

## 2016-02-09 NOTE — ED Provider Notes (Signed)
[facility] Emergency Department Provider Note  ____________________________________________  Time seen: Approximately 3:22 AM  I have reviewed the triage vital signs and the nursing notes.   HISTORY  Chief Complaint Allergic Reaction    HPI Tom Branch is a 20 y.o. male who comes into the hospital today with an allergic reaction. The patient reports that he is allergic to meet and he was out for Valentine's Day with his girlfriend. The patient reports that he had chicken but is unsure if they cooked chicken on the same stove is a may have cut some steak. He reports that he started breaking out on his face and legs and back. 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 The patient was also very itchy. He took some Benadryl and came into the hospital for evaluation. The patient's mother reports that his anaphylaxis usually occurs multiple hours after he has the ingestion. The patient had some chest tightness but reports that all of the symptoms have been improved. The patient's family was concerned so they brought him in for evaluation and treatment. He did not use his EpiPen.He did not experience any throat swelling or tongue swelling.   History reviewed. No pertinent past medical history.  There are no active problems to display for this patient.   History reviewed. No pertinent past surgical history.  Current Outpatient Rx  Name  Route  Sig  Dispense  Refill  . acetaminophen-codeine (TYLENOL #3) 300-30 MG per tablet   Oral   Take 1 tablet by mouth every 6 (six) hours as needed for moderate pain.   10 tablet   0   . methylphenidate 36 MG PO CR tablet   Oral   Take 36 mg by mouth daily.           Allergies Meat extract  No family history on file.  Social History Social History  Substance Use Topics  . Smoking status: Never Smoker   . Smokeless tobacco: None  . Alcohol Use: No    Review of  Systems Constitutional: No fever/chills Eyes: No visual changes. ENT: No sore throat. Cardiovascular: Chest tightness Respiratory: Denies shortness of breath. Gastrointestinal: No abdominal pain.  No nausea, no vomiting.  No diarrhea.  No constipation. Genitourinary: Negative for dysuria. Musculoskeletal: Negative for back pain. Skin:  rash. Neurological: Negative for headaches, focal weakness or numbness.  10-point ROS otherwise negative.  ____________________________________________   PHYSICAL EXAM:  VITAL SIGNS: ED Triage Vitals  Enc Vitals Group     BP 02/09/16 0023 141/84 mmHg     Pulse Rate 02/09/16 0023 82     Resp 02/09/16 0023 20     Temp 02/09/16 0023 97.7 F (36.5 C)     Temp Source 02/09/16 0023 Oral     SpO2 02/09/16 0023 100 %     Weight 02/09/16 0023 158 lb (71.668 kg)     Height 02/09/16 0023  (1.702 m)     Head Cir --      Peak Flow --      Pain Score --      Pain Loc --      Pain Edu? --      Excl. in GC? --     Constitutional: Alert and oriented. Well appearing and in no acute distress. Eyes: Conjunctivae are normal. PERRL. EOMI. Head: Atraumatic. Nose: No congestion/rhinnorhea. Mouth/Throat: Mucous membranes are moist.  Oropharynx non-erythematous. Cardiovascular: Normal rate, regular rhythm. Grossly normal heart sounds.  Good  peripheral circulation. Respiratory: Normal respiratory effort.  No retractions. Lungs CTAB. Gastrointestinal: Soft and nontender. No distention. Positive bowel sounds Musculoskeletal: No lower extremity tenderness nor edema.   Neurologic:  Normal speech and language.  Skin:  Skin is warm, dry and intact.  Psychiatric: Mood and affect are normal.   ____________________________________________   LABS (all labs ordered are listed, but only abnormal results are displayed)  Labs Reviewed - No data to display ____________________________________________  EKG  ED ECG REPORT I, Rebecka Apley, the attending  physician, personally viewed and interpreted this ECG.   Date: 02/09/2016  EKG Time: 0035  Rate: 74  Rhythm: normal sinus rhythm  Axis: normal  Intervals:none  ST&T Change: none  ____________________________________________  RADIOLOGY  None ____________________________________________   PROCEDURES  Procedure(s) performed: None  Critical Care performed: No  ____________________________________________   INITIAL IMPRESSION / ASSESSMENT AND PLAN / ED COURSE  Pertinent labs & imaging results that were available during my care of the patient were reviewed by me and considered in my medical decision making (see chart for details).  This is a 20 year old male who was brought into the hospital today with hives and itching after eating some chicken that may have gotten in contact with me. The patient in triage did receive some Pepcid and prednisone. By the time he was evaluated by myself his rash was gone. The patient also had no worsening of his symptoms. He will be discharged home to follow-up with his primary care physician. I will give him some prednisone and Pepcid for home. The patient and his mother have no further questions or concerns and I will give the patient a new prescription for his EpiPen. ____________________________________________   FINAL CLINICAL IMPRESSION(S) / ED DIAGNOSES  Final diagnoses:  Hives  Allergic reaction, initial encounter      Rebecka Apley, MD 02/09/16 0522

## 2016-02-09 NOTE — Discharge Instructions (Signed)
Allergies °An allergy is an abnormal reaction to a substance by the body's defense system (immune system). Allergies can develop at any age. °WHAT CAUSES ALLERGIES? °An allergic reaction happens when the immune system mistakenly reacts to a normally harmless substance, called an allergen, as if it were harmful. The immune system releases antibodies to fight the substance. Antibodies eventually release a chemical called histamine into the bloodstream. The release of histamine is meant to protect the body from infection, but it also causes discomfort. °An allergic reaction can be triggered by: °· Eating an allergen. °· Inhaling an allergen. °· Touching an allergen. °WHAT TYPES OF ALLERGIES ARE THERE? °There are many types of allergies. Common types include: °· Seasonal allergies. People with this type of allergy are usually allergic to substances that are only present during certain seasons, such as molds and pollens. °· Food allergies. °· Drug allergies. °· Insect allergies. °· Animal dander allergies. °WHAT ARE SYMPTOMS OF ALLERGIES? °Possible allergy symptoms include: °· Swelling of the lips, face, tongue, mouth, or throat. °· Sneezing, coughing, or wheezing. °· Nasal congestion. °· Tingling in the mouth. °· Rash. °· Itching. °· Itchy, red, swollen areas of skin (hives). °· Watery eyes. °· Vomiting. °· Diarrhea. °· Dizziness. °· Lightheadedness. °· Fainting. °· Trouble breathing or swallowing. °· Chest tightness. °· Rapid heartbeat. °HOW ARE ALLERGIES DIAGNOSED? °Allergies are diagnosed with a medical and family history and one or more of the following: °· Skin tests. °· Blood tests. °· A food diary. A food diary is a record of all the foods and drinks you have in a day and of all the symptoms you experience. °· The results of an elimination diet. An elimination diet involves eliminating foods from your diet and then adding them back in one by one to find out if a certain food causes an allergic reaction. °HOW ARE  ALLERGIES TREATED? °There is no cure for allergies, but allergic reactions can be treated with medicine. Severe reactions usually need to be treated at a hospital. °HOW CAN REACTIONS BE PREVENTED? °The best way to prevent an allergic reaction is by avoiding the substance you are allergic to. Allergy shots and medicines can also help prevent reactions in some cases. People with severe allergic reactions may be able to prevent a life-threatening reaction called anaphylaxis with a medicine given right after exposure to the allergen. °  °This information is not intended to replace advice given to you by your health care provider. Make sure you discuss any questions you have with your health care provider. °  °Document Released: 03/06/2003 Document Revised: 01/01/2015 Document Reviewed: 09/22/2014 °Elsevier Interactive Patient Education ©2016 [employer] ° °Hives °Hives are itchy, red, swollen areas of the skin. They can vary in size and location on your body. Hives can come and go for hours or several days (acute hives) or for several weeks (chronic hives). Hives do not spread from person to person (noncontagious). They may get worse with scratching, exercise, and emotional stress. °CAUSES  °· Allergic reaction to food, additives, or drugs. °· Infections, including the common cold. °· Illness, such as vasculitis, lupus, or thyroid disease. °· Exposure to sunlight, heat, or cold. °· Exercise. °· Stress. °· Contact with chemicals. °SYMPTOMS  °· Red or white swollen patches on the skin. The patches may change size, shape, and location quickly and repeatedly. °· Itching. °· Swelling of the hands, feet, and face. This may occur if hives develop deeper in the skin. °DIAGNOSIS  °Your caregiver can usually tell   what is wrong by performing a physical exam. Skin or blood tests may also be done to determine the cause of your hives. In some cases, the cause cannot be determined. °TREATMENT  °Mild cases usually get better with  medicines such as antihistamines. Severe cases may require an emergency epinephrine injection. If the cause of your hives is known, treatment includes avoiding that trigger.  °HOME CARE INSTRUCTIONS  °· Avoid causes that trigger your hives. °· Take antihistamines as directed by your caregiver to reduce the severity of your hives. Non-sedating or low-sedating antihistamines are usually recommended. Do not drive while taking an antihistamine. °· Take any other medicines prescribed for itching as directed by your caregiver. °· Wear loose-fitting clothing. °· Keep all follow-up appointments as directed by your caregiver. °SEEK MEDICAL CARE IF:  °· You have persistent or severe itching that is not relieved with medicine. °· You have painful or swollen joints. °SEEK IMMEDIATE MEDICAL CARE IF:  °· You have a fever. °· Your tongue or lips are swollen. °· You have trouble breathing or swallowing. °· You feel tightness in the throat or chest. °· You have abdominal pain. °These problems may be the first sign of a life-threatening allergic reaction. Call your local emergency services (911 in [place] °MAKE SURE YOU:  °· Understand these instructions. °· Will watch your condition. °· Will get help right away if you are not doing well or get worse. °  °This information is not intended to replace advice given to you by your health care provider. Make sure you discuss any questions you have with your health care provider. °  °Document Released: 12/11/2005 Document Revised: 12/16/2013 Document Reviewed: 03/05/2012 °Elsevier Interactive Patient Education ©2016 [employer] Inc. ° °

## 2016-02-09 NOTE — ED Notes (Addendum)
Pt to triage via wheelchair. Pt reports he went to a restaurant and ate chicken and he is allergic to meat.  Pt began itching at midnight after eating at 1830.  Pt took a benadryl with some relief.  No resp distress. Reports chest tightness.  No sob.

## 2016-02-09 NOTE — ED Notes (Signed)
Pt verbalizes understanding of discharge instructions.

## 2016-06-09 ENCOUNTER — Emergency Department
Admission: EM | Admit: 2016-06-09 | Discharge: 2016-06-09 | Disposition: A | Discharge status: Home / Self Care | Payer: Commercial Managed Care - HMO | Attending: Emergency Medicine | Admitting: Emergency Medicine

## 2016-06-09 ENCOUNTER — Emergency Department: Payer: Commercial Managed Care - HMO

## 2016-06-09 ENCOUNTER — Encounter: Payer: Self-pay | Admitting: Emergency Medicine

## 2016-06-09 DIAGNOSIS — N201 Calculus of ureter: Secondary | ICD-10-CM

## 2016-06-09 DIAGNOSIS — R319 Hematuria, unspecified: Secondary | ICD-10-CM

## 2016-06-09 DIAGNOSIS — R109 Unspecified abdominal pain: Secondary | ICD-10-CM | POA: Diagnosis present

## 2016-06-09 HISTORY — DX: Calculus of kidney: N20.0

## 2016-06-09 LAB — CBC
HCT: 44 % (ref 40.0–52.0)
HEMOGLOBIN: 14.8 g/dL (ref 13.0–18.0)
MCH: 31.1 pg (ref 26.0–34.0)
MCHC: 33.6 g/dL (ref 32.0–36.0)
MCV: 92.5 fL (ref 80.0–100.0)
Platelets: 280 10*3/uL (ref 150–440)
RBC: 4.76 MIL/uL (ref 4.40–5.90)
RDW: 14.2 % (ref 11.5–14.5)
WBC: 8.3 10*3/uL (ref 3.8–10.6)

## 2016-06-09 LAB — URINALYSIS COMPLETE WITH MICROSCOPIC (ARMC ONLY)
BILIRUBIN URINE: NEGATIVE
GLUCOSE, UA: NEGATIVE mg/dL
Ketones, ur: NEGATIVE mg/dL
Leukocytes, UA: NEGATIVE
Nitrite: NEGATIVE
PH: 7 (ref 5.0–8.0)
Protein, ur: NEGATIVE mg/dL
SQUAMOUS EPITHELIAL / LPF: NONE SEEN
Specific Gravity, Urine: 1.017 (ref 1.005–1.030)

## 2016-06-09 LAB — BASIC METABOLIC PANEL
Anion gap: 7 (ref 5–15)
BUN: 8 mg/dL (ref 6–20)
CHLORIDE: 108 mmol/L (ref 101–111)
CO2: 24 mmol/L (ref 22–32)
Calcium: 9.1 mg/dL (ref 8.9–10.3)
Creatinine, Ser: 1.15 mg/dL (ref 0.61–1.24)
GFR calc Af Amer: >60 mL/min (ref >60)
GFR calc non Af Amer: >60 mL/min (ref >60)
Glucose, Bld: 96 mg/dL (ref 65–99)
POTASSIUM: 4.3 mmol/L (ref 3.5–5.1)
SODIUM: 139 mmol/L (ref 135–145)

## 2016-06-09 MED ORDER — NAPROXEN 500 MG PO TABS: 500.0000 mg | ORAL_TABLET | Freq: Two times a day (BID) | ORAL | Status: DC

## 2016-06-09 MED ORDER — ONDANSETRON 4 MG PO TBDP: 4.0000 mg | ORAL_TABLET | Freq: Three times a day (TID) | ORAL | Status: DC | PRN

## 2016-06-09 MED ORDER — OXYCODONE-ACETAMINOPHEN 5-325 MG PO TABS: 1.0000 | ORAL_TABLET | Freq: Four times a day (QID) | ORAL | Status: DC | PRN

## 2016-06-09 MED ORDER — TAMSULOSIN HCL 0.4 MG PO CAPS: 0.4000 mg | ORAL_CAPSULE | Freq: Every day | ORAL | Status: DC

## 2016-06-09 MED ORDER — SODIUM CHLORIDE 0.9 % IV BOLUS (SEPSIS)
1000.0000 mL | Freq: Once | INTRAVENOUS | Status: AC
  Administered 2016-06-09: 1000 mL via INTRAVENOUS

## 2016-06-09 MED ORDER — KETOROLAC TROMETHAMINE 30 MG/ML IJ SOLN
10.0000 mg | Freq: Once | INTRAMUSCULAR | Status: AC
  Administered 2016-06-09: 9.9 mg via INTRAVENOUS
  Filled 2016-06-09: qty 1

## 2016-06-09 NOTE — Discharge Instructions (Signed)
Flank Pain °Flank pain refers to pain that is located on the side of the body between the upper abdomen and the back. The pain may occur over a short period of time (acute) or may be long-term or reoccurring (chronic). It may be mild or severe. Flank pain can be caused by many things. °CAUSES  °Some of the more common causes of flank pain include: °· Muscle strains.   °· Muscle spasms.   °· A disease of your spine (vertebral disk disease).   °· A lung infection (pneumonia).   °· Fluid around your lungs (pulmonary edema).   °· A kidney infection.   °· Kidney stones.   °· A very painful skin rash caused by the chickenpox virus (shingles).   °· Gallbladder disease.   °HOME CARE INSTRUCTIONS  °Home care will depend on the cause of your pain. In general, °· Rest as directed by your caregiver. °· Drink enough fluids to keep your urine clear or pale yellow. °· Only take over-the-counter or prescription medicines as directed by your caregiver. Some medicines may help relieve the pain. °· Tell your caregiver about any changes in your pain. °· Follow up with your caregiver as directed. °SEEK IMMEDIATE MEDICAL CARE IF:  °· Your pain is not controlled with medicine.   °· You have new or worsening symptoms. °· Your pain increases.   °· You have abdominal pain.   °· You have shortness of breath.   °· You have persistent nausea or vomiting.   °· You have swelling in your abdomen.   °· You feel faint or pass out.   °· You have blood in your urine. °· You have a fever or persistent symptoms for more than 2-3 days. °· You have a fever and your symptoms suddenly get worse. °MAKE SURE YOU:  °· Understand these instructions. °· Will watch your condition. °· Will get help right away if you are not doing well or get worse. °  °This information is not intended to replace advice given to you by your health care provider. Make sure you discuss any questions you have with your health care provider. °  °Document Released: 02/01/2006 Document  Revised: 09/04/2012 Document Reviewed: 07/25/2012 °Elsevier Interactive Patient Education ©2016 Elsevier Inc. ° °Hematuria, Adult °Hematuria is blood in your urine. It can be caused by a bladder infection, kidney infection, prostate infection, kidney stone, or cancer of your urinary tract. Infections can usually be treated with medicine, and a kidney stone usually will pass through your urine. If neither of these is the cause of your hematuria, further workup to find out the reason may be needed. °It is very important that you tell your health care provider about any blood you see in your urine, even if the blood stops without treatment or happens without causing pain. Blood in your urine that happens and then stops and then happens again can be a symptom of a very serious condition. Also, pain is not a symptom in the initial stages of many urinary cancers. °HOME CARE INSTRUCTIONS  °· Drink lots of fluid, 3-4 quarts a day. If you have been diagnosed with an infection, cranberry juice is especially recommended, in addition to large amounts of water. °· Avoid caffeine, tea, and carbonated beverages because they tend to irritate the bladder. °· Avoid alcohol because it may irritate the prostate. °· Take all medicines as directed by your health care provider. °· If you were prescribed an antibiotic medicine, finish it all even if you start to feel better. °· If you have been diagnosed with a kidney stone, follow your   health care provider's instructions regarding straining your urine to catch the stone. °· Empty your bladder often. Avoid holding urine for long periods of time. °· After a bowel movement, women should cleanse front to back. Use each tissue only once. °· Empty your bladder before and after sexual intercourse if you are a male. °SEEK MEDICAL CARE IF: °· You develop back pain. °· You have a fever. °· You have a feeling of sickness in your stomach (nausea) or vomiting. °· Your symptoms are not better in 3  days. Return sooner if you are getting worse. °SEEK IMMEDIATE MEDICAL CARE IF:  °· You develop severe vomiting and are unable to keep the medicine down. °· You develop severe back or abdominal pain despite taking your medicines. °· You begin passing a large amount of blood or clots in your urine. °· You feel extremely weak or faint, or you pass out. °MAKE SURE YOU:  °· Understand these instructions. °· Will watch your condition. °· Will get help right away if you are not doing well or get worse. °  °This information is not intended to replace advice given to you by your health care provider. Make sure you discuss any questions you have with your health care provider. °  °Document Released: 12/11/2005 Document Revised: 01/01/2015 Document Reviewed: 08/11/2013 °Elsevier Interactive Patient Education ©2016 Elsevier Inc. ° °Kidney Stones °Kidney stones (urolithiasis) are deposits that form inside your kidneys. The intense pain is caused by the stone moving through the urinary tract. When the stone moves, the ureter goes into spasm around the stone. The stone is usually passed in the urine.  °CAUSES  °· A disorder that makes certain neck glands produce too much parathyroid hormone (primary hyperparathyroidism). °· A buildup of uric acid crystals, similar to gout in your joints. °· Narrowing (stricture) of the ureter. °· A kidney obstruction present at birth (congenital obstruction). °· Previous surgery on the kidney or ureters. °· Numerous kidney infections. °SYMPTOMS  °· Feeling sick to your stomach (nauseous). °· Throwing up (vomiting). °· Blood in the urine (hematuria). °· Pain that usually spreads (radiates) to the groin. °· Frequency or urgency of urination. °DIAGNOSIS  °· Taking a history and physical exam. °· Blood or urine tests. °· CT scan. °· Occasionally, an examination of the inside of the urinary bladder (cystoscopy) is performed. °TREATMENT  °· Observation. °· Increasing your fluid intake. °· Extracorporeal  shock wave lithotripsy--This is a noninvasive procedure that uses shock waves to break up kidney stones. °· Surgery may be needed if you have severe pain or persistent obstruction. There are various surgical procedures. Most of the procedures are performed with the use of small instruments. Only small incisions are needed to accommodate these instruments, so recovery time is minimized. °The size, location, and chemical composition are all important variables that will determine the proper choice of action for you. Talk to your health care provider to better understand your situation so that you will minimize the risk of injury to yourself and your kidney.  °HOME CARE INSTRUCTIONS  °· Drink enough water and fluids to keep your urine clear or pale yellow. This will help you to pass the stone or stone fragments. °· Strain all urine through the provided strainer. Keep all particulate matter and stones for your health care provider to see. The stone causing the pain may be as small as a grain of salt. It is very important to use the strainer each and every time you pass your urine. The   collection of your stone will allow your health care provider to analyze it and verify that a stone has actually passed. The stone analysis will often identify what you can do to reduce the incidence of recurrences. °· Only take over-the-counter or prescription medicines for pain, discomfort, or fever as directed by your health care provider. °· Keep all follow-up visits as told by your health care provider. This is important. °· Get follow-up X-rays if required. The absence of pain does not always mean that the stone has passed. It may have only stopped moving. If the urine remains completely obstructed, it can cause loss of kidney function or even complete destruction of the kidney. It is your responsibility to make sure X-rays and follow-ups are completed. Ultrasounds of the kidney can show blockages and the status of the kidney.  Ultrasounds are not associated with any radiation and can be performed easily in a matter of minutes. °· Make changes to your daily diet as told by your health care provider. You may be told to: °¨ Limit the amount of salt that you eat. °¨ Eat 5 or more servings of fruits and vegetables each day. °¨ Limit the amount of meat, poultry, fish, and eggs that you eat. °· Collect a 24-hour urine sample as told by your health care provider. You may need to collect another urine sample every 6-12 months. °SEEK MEDICAL CARE IF: °· You experience pain that is progressive and unresponsive to any pain medicine you have been prescribed. °SEEK IMMEDIATE MEDICAL CARE IF:  °· Pain cannot be controlled with the prescribed medicine. °· You have a fever or shaking chills. °· The severity or intensity of pain increases over 18 hours and is not relieved by pain medicine. °· You develop a new onset of abdominal pain. °· You feel faint or pass out. °· You are unable to urinate. °  °This information is not intended to replace advice given to you by your health care provider. Make sure you discuss any questions you have with your health care provider. °  °Document Released: 12/11/2005 Document Revised: 09/01/2015 Document Reviewed: 05/14/2013 °Elsevier Interactive Patient Education ©2016 Elsevier Inc. ° °

## 2016-06-09 NOTE — ED Notes (Signed)
Pt has left flank pain, has had kidney stones in the past, states the pain is the same.

## 2016-06-09 NOTE — ED Provider Notes (Signed)
[facility] Emergency Department Provider Note  ____________________________________________  Time seen: 6:00 PM  I have reviewed the triage vital signs and the nursing notes.   HISTORY  Chief Complaint Flank Pain    HPI Tom Branch is a 20 y.o. male who complains of left flank pain nonradiating, colicky and severe in the past 6 hours. No dysuria frequency urgency. Has a history of a 9 mm kidney stone requiring ureteral stenting. No fever chills or chest pain or shortness of breath. Had an episode of vomiting earlier today. No aggravating or alleviating factors.     Past Medical History  Diagnosis Date  . Kidney stones      There are no active problems to display for this patient.    History reviewed. No pertinent past surgical history. Ureteral stenting on the left  Current Outpatient Rx  Name  Route  Sig  Dispense  Refill  . acetaminophen-codeine (TYLENOL #3) 300-30 MG per tablet   Oral   Take 1 tablet by mouth every 6 (six) hours as needed for moderate pain.   10 tablet   0   . EPINEPHrine (EPIPEN 2-PAK) 0.3 mg/0.3 mL IJ SOAJ injection   Intramuscular   Inject 0.3 mLs (0.3 mg total) into the muscle once.   1 Device   0   . famotidine (PEPCID) 40 MG tablet   Oral   Take 1 tablet (40 mg total) by mouth every evening.   7 tablet   0   . methylphenidate 36 MG PO CR tablet   Oral   Take 36 mg by mouth daily.         . naproxen (NAPROSYN) 500 MG tablet   Oral   Take 1 tablet (500 mg total) by mouth 2 (two) times daily with a meal.   20 tablet   0   . ondansetron (ZOFRAN ODT) 4 MG disintegrating tablet   Oral   Take 1 tablet (4 mg total) by mouth every 8 (eight) hours as needed for nausea or vomiting.   20 tablet   0   . oxyCODONE-acetaminophen (ROXICET) 5-325 MG tablet   Oral   Take 1 tablet by mouth every 6 (six) hours as needed for severe pain.   12 tablet   0   . predniSONE (DELTASONE) 20 MG tablet   Oral  Take 3 tablets (60 mg total) by mouth daily.   12 tablet   0   . tamsulosin (FLOMAX) 0.4 MG CAPS capsule   Oral   Take 1 capsule (0.4 mg total) by mouth daily.   30 capsule   0      Allergies Dilaudid; Latex; and Meat extract   No family history on file.  Social History Social History  Substance Use Topics  . Smoking status: Never Smoker   . Smokeless tobacco: None  . Alcohol Use: No    Review of Systems  Constitutional:   No fever or chills.  Eyes:   No vision changes.  ENT:   No sore throat. No rhinorrhea. Cardiovascular:   No chest pain. Respiratory:   No dyspnea or cough. Gastrointestinal:   Severe left flank pain as above, vomiting, no diarrhea.  Genitourinary:   Negative for dysuria or difficulty urinating. Musculoskeletal:   Negative for focal pain or swelling Neurological:   Negative for headaches 10-point ROS otherwise negative.  ____________________________________________   PHYSICAL EXAM:  VITAL SIGNS: ED Triage Vitals  Enc Vitals Group     BP 06/09/16 1744  166/79 mmHg     Pulse Rate 06/09/16 1744 86     Resp 06/09/16 1744 18     Temp 06/09/16 1744 98 F (36.7 C)     Temp Source 06/09/16 1744 Oral     SpO2 06/09/16 1744 98 %     Weight 06/09/16 1739 160 lb (72.576 kg)     Height 06/09/16 1739 5\' 7"  (1.702 m)     Head Cir --      Peak Flow --      Pain Score 06/09/16 1739 9     Pain Loc --      Pain Edu? --      Excl. in GC? --     Vital signs reviewed, nursing assessments reviewed.   Constitutional:   Alert and oriented. Well appearing and in no distress. Eyes:   No scleral icterus. No conjunctival pallor. PERRL. EOMI.  No nystagmus. ENT   Head:   Normocephalic and atraumatic.   Nose:   No congestion/rhinnorhea. No septal hematoma   Mouth/Throat:   MMM, no pharyngeal erythema. No peritonsillar mass.    Neck:   No stridor. No SubQ emphysema. No meningismus. Hematological/Lymphatic/Immunilogical:   No cervical  lymphadenopathy. Cardiovascular:   RRR. Symmetric bilateral radial and DP pulses.  No murmurs.  Respiratory:   Normal respiratory effort without tachypnea nor retractions. Breath sounds are clear and equal bilaterally. No wheezes/rales/rhonchi. Gastrointestinal:   Soft With left-sided tenderness. Non distended. There is left-sided CVA tenderness.  No rebound, rigidity, or guarding. Genitourinary:   deferred Musculoskeletal:   Nontender with normal range of motion in all extremities. No joint effusions.  No lower extremity tenderness.  No edema. Neurologic:   Normal speech and language.  CN 2-10 normal. Motor grossly intact. No gross focal neurologic deficits are appreciated.  Skin:    Skin is warm, dry and intact. No rash noted.  No petechiae, purpura, or bullae.  ____________________________________________    LABS (pertinent positives/negatives) (all labs ordered are listed, but only abnormal results are displayed) Labs Reviewed  URINALYSIS COMPLETEWITH MICROSCOPIC ([facility] ONLY) - Abnormal; Notable for the following:    Color, Urine YELLOW (*)    APPearance CLEAR (*)    Hgb urine dipstick 2+ (*)    Bacteria, UA RARE (*)    All other components within normal limits  CBC  BASIC METABOLIC PANEL   ____________________________________________   EKG    ____________________________________________    RADIOLOGY  CT abdomen and pelvis reveals a 5 mm stone in the left ureter with some associated hydronephrosis.  ____________________________________________   PROCEDURES   ____________________________________________   INITIAL IMPRESSION / ASSESSMENT AND PLAN / ED COURSE  Pertinent labs & imaging results that were available during my care of the patient were reviewed by me and considered in my medical decision making (see chart for details).  Patient presents with left flank pain consistent with renal colic. Workup reveals microscopic hematuria, 5 mm stone, no associated  urinary tract infection. We'll treat with NSAIDs Zofran Percocet Flomax, follow-up with PCP and urology. Very well-appearing unremarkable vital signs. Calm and comfortable. Suitable for outpatient follow-up.     ____________________________________________   FINAL CLINICAL IMPRESSION(S) / ED DIAGNOSES  Final diagnoses:  Ureterolithiasis  Hematuria  Left flank pain       Portions of this note were generated with dragon dictation software. Dictation errors may occur despite best attempts at proofreading.   Sharman CheekPhillip Aubreyana Saltz, MD 06/09/16 2032

## 2016-06-15 ENCOUNTER — Emergency Department: Payer: Commercial Managed Care - HMO

## 2016-06-15 ENCOUNTER — Emergency Department
Admission: EM | Admit: 2016-06-15 | Discharge: 2016-06-15 | Disposition: A | Discharge status: Home / Self Care | Payer: Commercial Managed Care - HMO | Attending: Emergency Medicine | Admitting: Emergency Medicine

## 2016-06-15 ENCOUNTER — Encounter: Payer: Self-pay | Admitting: Emergency Medicine

## 2016-06-15 DIAGNOSIS — Y92410 Unspecified street and highway as the place of occurrence of the external cause: Secondary | ICD-10-CM | POA: Insufficient documentation

## 2016-06-15 DIAGNOSIS — Y999 Unspecified external cause status: Secondary | ICD-10-CM | POA: Insufficient documentation

## 2016-06-15 DIAGNOSIS — Z79899 Other long term (current) drug therapy: Secondary | ICD-10-CM | POA: Insufficient documentation

## 2016-06-15 DIAGNOSIS — Y939 Activity, unspecified: Secondary | ICD-10-CM | POA: Diagnosis not present

## 2016-06-15 DIAGNOSIS — S20212A Contusion of left front wall of thorax, initial encounter: Secondary | ICD-10-CM | POA: Diagnosis not present

## 2016-06-15 DIAGNOSIS — S63502A Unspecified sprain of left wrist, initial encounter: Secondary | ICD-10-CM

## 2016-06-15 DIAGNOSIS — M25532 Pain in left wrist: Secondary | ICD-10-CM | POA: Diagnosis present

## 2016-06-15 MED ORDER — CYCLOBENZAPRINE HCL 10 MG PO TABS
5.0000 mg | ORAL_TABLET | Freq: Once | ORAL | Status: AC
  Administered 2016-06-15: 5 mg via ORAL
  Filled 2016-06-15: qty 1

## 2016-06-15 MED ORDER — KETOROLAC TROMETHAMINE 60 MG/2ML IM SOLN
30.0000 mg | Freq: Once | INTRAMUSCULAR | Status: AC
  Administered 2016-06-15: 30 mg via INTRAMUSCULAR
  Filled 2016-06-15: qty 2

## 2016-06-15 MED ORDER — KETOROLAC TROMETHAMINE 10 MG PO TABS: 10.0000 mg | ORAL_TABLET | Freq: Three times a day (TID) | ORAL | Status: DC

## 2016-06-15 MED ORDER — CYCLOBENZAPRINE HCL 5 MG PO TABS: 5.0000 mg | ORAL_TABLET | Freq: Three times a day (TID) | ORAL | Status: DC | PRN
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
## 2016-12-08 ENCOUNTER — Encounter: Payer: Self-pay | Admitting: *Deleted

## 2016-12-13 ENCOUNTER — Ambulatory Visit: Payer: Commercial Managed Care - HMO | Admitting: Anesthesiology

## 2016-12-13 ENCOUNTER — Encounter
Admission: RE | Disposition: A | Discharge status: Home / Self Care | Payer: Self-pay | Source: Ambulatory Visit | Attending: Otolaryngology

## 2016-12-13 ENCOUNTER — Emergency Department
Admission: EM | Admit: 2016-12-13 | Discharge: 2016-12-13 | Disposition: A | Discharge status: Home / Self Care | Payer: Commercial Managed Care - HMO | Source: Home / Self Care | Attending: Emergency Medicine | Admitting: Emergency Medicine

## 2016-12-13 ENCOUNTER — Ambulatory Visit
Admission: RE | Admit: 2016-12-13 | Discharge: 2016-12-13 | Disposition: A | Discharge status: Home / Self Care | Payer: Commercial Managed Care - HMO | Source: Ambulatory Visit | Attending: Otolaryngology | Admitting: Otolaryngology

## 2016-12-13 ENCOUNTER — Encounter: Payer: Self-pay | Admitting: *Deleted

## 2016-12-13 DIAGNOSIS — F909 Attention-deficit hyperactivity disorder, unspecified type: Secondary | ICD-10-CM | POA: Insufficient documentation

## 2016-12-13 DIAGNOSIS — Z91040 Latex allergy status: Secondary | ICD-10-CM | POA: Insufficient documentation

## 2016-12-13 DIAGNOSIS — J3501 Chronic tonsillitis: Secondary | ICD-10-CM | POA: Insufficient documentation

## 2016-12-13 DIAGNOSIS — L299 Pruritus, unspecified: Secondary | ICD-10-CM

## 2016-12-13 DIAGNOSIS — T7840XA Allergy, unspecified, initial encounter: Secondary | ICD-10-CM

## 2016-12-13 HISTORY — DX: Attention-deficit hyperactivity disorder, unspecified type: F90.9

## 2016-12-13 HISTORY — DX: Encounter for adoption services: Z02.82

## 2016-12-13 HISTORY — DX: Other specified health status: Z78.9

## 2016-12-13 HISTORY — PX: TONSILLECTOMY: SHX5217

## 2016-12-13 SURGERY — TONSILLECTOMY
Anesthesia: General

## 2016-12-13 MED ORDER — ACETAMINOPHEN 10 MG/ML IV SOLN
INTRAVENOUS | Status: DC | PRN
  Administered 2016-12-13: 1000 mg via INTRAVENOUS

## 2016-12-13 MED ORDER — DEXAMETHASONE SODIUM PHOSPHATE 10 MG/ML IJ SOLN
20.0000 mg | Freq: Once | INTRAMUSCULAR | Status: AC
Start: 2016-12-13 — End: 2016-12-13
  Administered 2016-12-13: 20 mg via INTRAMUSCULAR
  Filled 2016-12-13: qty 2

## 2016-12-13 MED ORDER — OXYCODONE HCL 5 MG/5ML PO SOLN: 10.0000 mg | ORAL | 0 refills | Status: DC | PRN

## 2016-12-13 MED ORDER — PROMETHAZINE HCL 12.5 MG PO TABS: 12.5000 mg | ORAL_TABLET | Freq: Four times a day (QID) | ORAL | 0 refills | Status: DC | PRN

## 2016-12-13 MED ORDER — PREDNISOLONE SODIUM PHOSPHATE 15 MG/5ML PO SOLN: 60.0000 mg | Freq: Every day | ORAL | 0 refills | Status: AC

## 2016-12-13 MED ORDER — OXYCODONE HCL 5 MG PO TABS: 5.0000 mg | ORAL_TABLET | Freq: Once | ORAL | Status: DC | PRN

## 2016-12-13 MED ORDER — FENTANYL CITRATE (PF) 100 MCG/2ML IJ SOLN: 25.0000 ug | INTRAMUSCULAR | Status: DC | PRN

## 2016-12-13 MED ORDER — LIDOCAINE HCL (CARDIAC) 20 MG/ML IV SOLN
INTRAVENOUS | Status: DC | PRN
  Administered 2016-12-13: 10 mg via INTRAVENOUS

## 2016-12-13 MED ORDER — FAMOTIDINE 40 MG/5ML PO SUSR: 20.0000 mg | Freq: Every day | ORAL | 0 refills | Status: DC

## 2016-12-13 MED ORDER — BUPIVACAINE HCL (PF) 0.25 % IJ SOLN
INTRAMUSCULAR | Status: DC | PRN
  Administered 2016-12-13: 1.5 mL

## 2016-12-13 MED ORDER — ONDANSETRON HCL 4 MG/2ML IJ SOLN
4.0000 mg | Freq: Once | INTRAMUSCULAR | Status: AC | PRN
  Administered 2016-12-13: 4 mg via INTRAVENOUS

## 2016-12-13 MED ORDER — OXYCODONE HCL 5 MG/5ML PO SOLN: 5.0000 mg | Freq: Once | ORAL | Status: DC | PRN

## 2016-12-13 MED ORDER — LACTATED RINGERS IV SOLN
INTRAVENOUS | Status: DC
  Administered 2016-12-13 (×3): via INTRAVENOUS

## 2016-12-13 MED ORDER — PROPOFOL 10 MG/ML IV BOLUS
INTRAVENOUS | Status: DC | PRN
  Administered 2016-12-13: 200 mg via INTRAVENOUS

## 2016-12-13 MED ORDER — LIDOCAINE VISCOUS 2 % MT SOLN: 10.0000 mL | Freq: Four times a day (QID) | OROMUCOSAL | 0 refills | Status: DC | PRN

## 2016-12-13 MED ORDER — GLYCOPYRROLATE 0.2 MG/ML IJ SOLN
INTRAMUSCULAR | Status: DC | PRN
  Administered 2016-12-13: 0.2 mg via INTRAVENOUS

## 2016-12-13 MED ORDER — DIPHENHYDRAMINE HCL 12.5 MG/5ML PO SYRP: 50.0000 mg | ORAL_SOLUTION | Freq: Four times a day (QID) | ORAL | 0 refills | Status: DC | PRN

## 2016-12-13 MED ORDER — MIDAZOLAM HCL 2 MG/2ML IJ SOLN
INTRAMUSCULAR | Status: DC | PRN
  Administered 2016-12-13: 2 mg via INTRAVENOUS

## 2016-12-13 MED ORDER — IBUPROFEN 100 MG/5ML PO SUSP: 400.0000 mg | Freq: Once | ORAL | Status: DC

## 2016-12-13 MED ORDER — ACETAMINOPHEN 10 MG/ML IV SOLN
1000.0000 mg | Freq: Once | INTRAVENOUS | Status: AC
  Administered 2016-12-13: 1000 mg via INTRAVENOUS

## 2016-12-13 MED ORDER — OXYMETAZOLINE HCL 0.05 % NA SOLN
NASAL | Status: DC | PRN
  Administered 2016-12-13: 1 via TOPICAL

## 2016-12-13 MED ORDER — FAMOTIDINE 40 MG/5ML PO SUSR
40.0000 mg | Freq: Once | ORAL | Status: AC
  Administered 2016-12-13: 40 mg via ORAL
  Filled 2016-12-13 (×2): qty 5

## 2016-12-13 MED ORDER — DEXAMETHASONE SODIUM PHOSPHATE 4 MG/ML IJ SOLN
INTRAMUSCULAR | Status: DC | PRN
  Administered 2016-12-13: 4 mg via INTRAVENOUS

## 2016-12-13 MED ORDER — DIPHENHYDRAMINE HCL 50 MG/ML IJ SOLN
50.0000 mg | Freq: Once | INTRAMUSCULAR | Status: AC
  Administered 2016-12-13: 50 mg via INTRAMUSCULAR
  Filled 2016-12-13: qty 1

## 2016-12-13 MED ORDER — FENTANYL CITRATE (PF) 100 MCG/2ML IJ SOLN
INTRAMUSCULAR | Status: DC | PRN
  Administered 2016-12-13 (×2): 100 ug via INTRAVENOUS

## 2016-12-13 MED ORDER — ONDANSETRON HCL 4 MG/2ML IJ SOLN
INTRAMUSCULAR | Status: DC | PRN
  Administered 2016-12-13: 4 mg via INTRAVENOUS

## 2016-12-13 MED ORDER — OXYCODONE HCL 5 MG/5ML PO SOLN
5.0000 mg | Freq: Once | ORAL | Status: AC
Start: 2016-12-13 — End: 2016-12-13
  Administered 2016-12-13: 5 mg via ORAL

## 2016-12-13 MED ORDER — SCOPOLAMINE 1 MG/3DAYS TD PT72
1.0000 | MEDICATED_PATCH | Freq: Once | TRANSDERMAL | Status: DC
  Administered 2016-12-13: 1.5 mg via TRANSDERMAL

## 2016-12-13 SURGICAL SUPPLY — 17 items
BLADE BOVIE TIP EXT 4 (BLADE) ×3 IMPLANT
CANISTER SUCT 1200ML W/VALVE (MISCELLANEOUS) ×3 IMPLANT
CATH SUCT 10F CTRL PORT VENT (CATHETERS) ×3 IMPLANT
COAG SUCT 10F 3.5MM HAND CTRL (MISCELLANEOUS) ×3 IMPLANT
GLOVE BIO SURGEON STRL SZ7.5 (GLOVE) ×6 IMPLANT
HANDLE SUCTION POOLE (INSTRUMENTS) ×1 IMPLANT
KIT ROOM TURNOVER OR (KITS) ×3 IMPLANT
NEEDLE HYPO 25GX1X1/2 BEV (NEEDLE) ×3 IMPLANT
NS IRRIG 500ML POUR BTL (IV SOLUTION) ×3 IMPLANT
PACK TONSIL/ADENOIDS (PACKS) ×3 IMPLANT
PAD GROUND ADULT SPLIT (MISCELLANEOUS) ×3 IMPLANT
PENCIL ELECTRO HAND CTR (MISCELLANEOUS) ×3 IMPLANT
SOL ANTI-FOG 6CC FOG-OUT (MISCELLANEOUS) ×1 IMPLANT
SOL FOG-OUT ANTI-FOG 6CC (MISCELLANEOUS) ×2
STRAP BODY AND KNEE 60X3 (MISCELLANEOUS) ×3 IMPLANT
SUCTION POOLE HANDLE (INSTRUMENTS) ×3
SYR 5ML LL (SYRINGE) ×3 IMPLANT

## 2016-12-13 NOTE — Anesthesia Preprocedure Evaluation (Signed)
Anesthesia Evaluation  Patient identified by MRN, date of birth, ID band Patient awake    Reviewed: Allergy & Precautions, H&P , NPO status , Patient's Chart, lab work & pertinent test results  Airway Mallampati: II  TM Distance: >3 FB Neck ROM: full    Dental no notable dental hx.    Pulmonary    Pulmonary exam normal        Cardiovascular Normal cardiovascular exam     Neuro/Psych PSYCHIATRIC DISORDERS    GI/Hepatic   Endo/Other    Renal/GU      Musculoskeletal   Abdominal   Peds  Hematology   Anesthesia Other Findings   Reproductive/Obstetrics                             Anesthesia Physical Anesthesia Plan  ASA: II  Anesthesia Plan: General ETT   Post-op Pain Management:    Induction:   Airway Management Planned:   Additional Equipment:   Intra-op Plan:   Post-operative Plan:   Informed Consent: I have reviewed the patients History and Physical, chart, labs and discussed the procedure including the risks, benefits and alternatives for the proposed anesthesia with the patient or authorized representative who has indicated his/her understanding and acceptance.     Plan Discussed with:   Anesthesia Plan Comments:         Anesthesia Quick Evaluation

## 2016-12-13 NOTE — Anesthesia Postprocedure Evaluation (Signed)
Anesthesia Post Note  Patient: Tom Branch  Procedure(s) Performed: Procedure(s) (LRB): TONSILLECTOMY (N/A)  Patient location during evaluation: PACU Anesthesia Type: General Level of consciousness: awake and alert and oriented Pain management: satisfactory to patient Vital Signs Assessment: post-procedure vital signs reviewed and stable Respiratory status: spontaneous breathing, nonlabored ventilation and respiratory function stable Cardiovascular status: blood pressure returned to baseline and stable Postop Assessment: Adequate PO intake and No signs of nausea or vomiting Anesthetic complications: no    Cherly BeachStella, Elex Mainwaring J

## 2016-12-13 NOTE — Discharge Instructions (Signed)
T & A INSTRUCTION SHEET - [facility] °[facility] ° °CREIGHTON VAUGHT, MD °PAUL H. JUENGEL, MD  °P. [facility]T °CHAPMAN MCQUEEN, MD ° °[address] [place], [place] [address] TEL. [phone] °[address] [place] [address] [phone] ° °INFORMATION SHEET FOR A TONSILLECTOMY AND ADENDOIDECTOMY ° °About Your Tonsils and Adenoids ° The tonsils and adenoids are normal body tissues that are part of our immune system.  They normally help to protect us against diseases that may enter our mouth and nose.  However, sometimes the tonsils and/or adenoids become too large and obstruct our breathing, especially at night. °  ° If either of these things happen it helps to remove the tonsils and adenoids in order to become healthier. The operation to remove the tonsils and adenoids is called a tonsillectomy and adenoidectomy. ° °The Location of Your Tonsils and Adenoids ° The tonsils are located in the back of the throat on both side and sit in a cradle of muscles. The adenoids are located in the roof of the mouth, behind the nose, and closely associated with the opening of the Eustachian tube to the ear. ° °Surgery on Tonsils and Adenoids ° A tonsillectomy and adenoidectomy is a short operation which takes about thirty minutes.  This includes being put to sleep and being awakened.  Tonsillectomies and adenoidectomies are performed at [facility] and may require observation period in the recovery room prior to going home. ° °Following the Operation for a Tonsillectomy ° A cautery machine is used to control bleeding.  Bleeding from a tonsillectomy and adenoidectomy is minimal and postoperatively the risk of bleeding is approximately four percent, although this rarely life threatening. ° ° ° °After your tonsillectomy and adenoidectomy post-op care at home: ° °1. Our patients are able to go home the same day.  You may be given prescriptions for pain  medications and antibiotics, if indicated. °2. It is extremely important to remember that fluid intake is of utmost importance after a tonsillectomy.  The amount that you drink must be maintained in the postoperative period.  A good indication of whether a child is getting enough fluid is whether his/her urine output is constant.  As long as children are urinating or wetting their diaper every 6 - 8 hours this is usually enough fluid intake.   °3. Although rare, this is a risk of some bleeding in the first ten days after surgery.  This is usually occurs between day five and nine postoperatively.  This risk of bleeding is approximately four percent.  If you or your child should have any bleeding you should remain calm and notify our office or go directly to the Emergency Room at [facility] where they will contact us. Our doctors are available seven days a week for notification.  We recommend sitting up quietly in a chair, place an ice pack on the front of the neck and spitting out the blood gently until we are able to contact you.  Adults should gargle gently with ice water and this may help stop the bleeding.  If the bleeding does not stop after a short time, i.e. 10 to 15 minutes, or seems to be increasing again, please contact us or go to the hospital.   °4. It is common for the pain to be worse at 5 - 7 days postoperatively.  This occurs because the “scab” is peeling off and the mucous membrane (skin of   the throat) is growing back where the tonsils were.   °5. It is common for a low-grade fever, less than 102, during the first week after a tonsillectomy and adenoidectomy.  It is usually due to not drinking enough liquids, and we suggest your use liquid Tylenol or the pain medicine with Tylenol prescribed in order to keep your temperature below 102.  Please follow the directions on the back of the bottle. °6. Do not take aspirin or any products that contain aspirin such as Bufferin, Anacin,  Ecotrin, aspirin gum, Goodies, BC headache powders, etc., after a T&A because it can promote bleeding.  Please check with our office before administering any other medication that may been prescribed by other doctors during the two week post-operative period. °7. If you happen to look in the mirror or into your child’s mouth you will see white/gray patches on the back of the throat.  This is what a scab looks like in the mouth and is normal after having a T&A.  It will disappear once the tonsil area heals completely. However, it may cause a noticeable odor, and this too will disappear with time.     °8. You or your child may experience ear pain after having a T&A.  This is called referred pain and comes from the throat, but it is felt in the ears.  Ear pain is quite common and expected.  It will usually go away after ten days.  There is usually nothing wrong with the ears, and it is primarily due to the healing area stimulating the nerve to the ear that runs along the side of the throat.  Use either the prescribed pain medicine or Tylenol as needed.  °9. The throat tissues after a tonsillectomy are obviously sensitive.  Smoking around children who have had a tonsillectomy significantly increases the risk of bleeding.  DO NOT SMOKE!  ° °General Anesthesia, Adult, Care After °These instructions provide you with information about caring for yourself after your procedure. Your health care provider may also give you more specific instructions. Your treatment has been planned according to current medical practices, but problems sometimes occur. Call your health care provider if you have any problems or questions after your procedure. °What can I expect after the procedure? °After the procedure, it is common to have: °· Vomiting. °· A sore throat. °· Mental slowness. °It is common to feel: °· Nauseous. °· Cold or shivery. °· Sleepy. °· Tired. °· Sore or achy, even in parts of your body where you did not have  surgery. °Follow these instructions at home: °For at least 24 hours after the procedure:  °· Do not: °¨ Participate in activities where you could fall or become injured. °¨ Drive. °¨ Use heavy machinery. °¨ Drink alcohol. °¨ Take sleeping pills or medicines that cause drowsiness. °¨ Make important decisions or sign legal documents. °¨ Take care of children on your own. °· Rest. °Eating and drinking  °· If you vomit, drink water, juice, or soup when you can drink without vomiting. °· Drink enough fluid to keep your urine clear or pale yellow. °· Make sure you have little or no nausea before eating solid foods. °· Follow the diet recommended by your health care provider. °General instructions  °· Have a responsible adult stay with you until you are awake and alert. °· Return to your normal activities as told by your health care provider. Ask your health care provider what activities are safe for you. °· Take over-the-counter   and prescription medicines only as told by your health care provider. °· If you smoke, do not smoke without supervision. °· Keep all follow-up visits as told by your health care provider. This is important. °Contact a health care provider if: °· You continue to have nausea or vomiting at home, and medicines are not helpful. °· You cannot drink fluids or start eating again. °· You cannot urinate after 8-12 hours. °· You develop a skin rash. °· You have fever. °· You have increasing redness at the site of your procedure. °Get help right away if: °· You have difficulty breathing. °· You have chest pain. °· You have unexpected bleeding. °· You feel that you are having a life-threatening or urgent problem. °This information is not intended to replace advice given to you by your health care provider. Make sure you discuss any questions you have with your health care provider. °Document Released: 03/19/2001 Document Revised: 05/15/2016 Document Reviewed: 11/25/2015 °[employer] Interactive Patient Education  © 2017 [employer] ° °

## 2016-12-13 NOTE — ED Provider Notes (Signed)
[facility] Emergency Department Provider Note  ____________________________________________  Time seen: Approximately 10:27 PM  I have reviewed the triage vital signs and the nursing notes.   HISTORY  Chief Complaint Allergic Reaction    HPI Tom Branch is a 20 y.o. male who presents to the emergency department with his mother for complaint of generalized pruritus. Patient did have his tonsils removed this morning with no cough occasions. Patient is been taking solution form of pain medication throughout the day with no issues. Mother reports the patient has been drinking fluids and having soft foods such as putting some Jell-O. Patient had a, prepackaged pudding container this evening.Within 10 minutes, patient broke out in general itching. He denies any shortness of breath or wheezing. Patient does endorse pain to the throat but states that this is not changed from baseline throughout the day. Patient denies any sensation of his tongue swelling. He denies any visible rash or skin lesions. He has received 20 mg of diphenhydramine over the last 3 hours.   Past Medical History:  Diagnosis Date  . ADHD   . Adopted    no family medical hx  . Kidney stones     There are no active problems to display for this patient.   Past Surgical History:  Procedure Laterality Date  . LITHOTRIPSY    . WISDOM TOOTH EXTRACTION      Prior to Admission medications   Medication Sig Start Date End Date Taking? Authorizing Provider  diphenhydrAMINE (BENYLIN) 12.5 MG/5ML syrup Take 20 mLs (50 mg total) by mouth 4 (four) times daily as needed for itching. 12/13/16   Delorise RoyalsJonathan D Raford Brissett, PA-C  EPINEPHrine (EPIPEN 2-PAK) 0.3 mg/0.3 mL IJ SOAJ injection Inject 0.3 mLs (0.3 mg total) into the muscle once. 02/09/16   Rebecka ApleyAllison P Webster, MD  famotidine (PEPCID) 40 MG/5ML suspension Take 2.5 mLs (20 mg total) by mouth daily. 12/13/16 12/23/16  Christiane HaJonathan D Teandra Harlan, PA-C  lidocaine  (XYLOCAINE) 2 % solution Use as directed 10 mLs in the mouth or throat every 6 (six) hours as needed for mouth pain (Swish and spit). 12/13/16   Bud Facereighton Vaught, MD  methylphenidate 36 MG PO CR tablet Take 36 mg by mouth daily.    Historical Provider, MD  Omega-3 Fatty Acids (FISH OIL PO) Take by mouth daily.    Historical Provider, MD  oxyCODONE (ROXICODONE) 5 MG/5ML solution Take 10 mLs (10 mg total) by mouth every 4 (four) hours as needed for severe pain. 12/13/16   Bud Facereighton Vaught, MD  prednisoLONE (ORAPRED) 15 MG/5ML solution Take 20 mLs (60 mg total) by mouth daily. 12/13/16 12/13/17  Christiane HaJonathan D Torrian Canion, PA-C  promethazine (PHENERGAN) 12.5 MG tablet Take 1 tablet (12.5 mg total) by mouth every 6 (six) hours as needed for nausea or vomiting. 12/13/16   Bud Facereighton Vaught, MD  Vitamin D, Ergocalciferol, (DRISDOL) 50000 units CAPS capsule Take 50,000 Units by mouth every 7 (seven) days.    Historical Provider, MD    Allergies Dilaudid [hydromorphone hcl]; Latex; and Meat extract  No family history on file.  Social History Social History  Substance Use Topics  . Smoking status: Never Smoker  . Smokeless tobacco: Never Used  . Alcohol use No     Review of Systems  Constitutional: No fever/chills Eyes: No visual changes. No discharge ENT: Positive for sore throat from tonsillectomy. Patient denies any oropharyngeal swelling. Cardiovascular: no chest pain. Respiratory: no cough. No SOB. Gastrointestinal: No abdominal pain.  No nausea, no vomiting.  No diarrhea.  No constipation. Musculoskeletal: Negative for musculoskeletal pain. Skin: Negative for rash, abrasions, lacerations, ecchymosis. Positive for generalized pruritus Neurological: Negative for headaches, focal weakness or numbness. 10-point ROS otherwise negative.  ____________________________________________   PHYSICAL EXAM:  VITAL SIGNS: ED Triage Vitals  Enc Vitals Group     BP 12/13/16 2211 120/64     Pulse Rate  12/13/16 2211 64     Resp 12/13/16 2211 18     Temp 12/13/16 2211 99 F (37.2 C)     Temp Source 12/13/16 2211 Oral     SpO2 12/13/16 2211 98 %     Weight 12/13/16 2213 178 lb (80.7 kg)     Height 12/13/16 2213 5\' 6"  (1.676 m)     Head Circumference --      Peak Flow --      Pain Score 12/13/16 2213 0     Pain Loc --      Pain Edu? --      Excl. in GC? --      Constitutional: Alert and oriented. Well appearing and in no acute distress. Eyes: Conjunctivae are normal. PERRL. EOMI. Head: Atraumatic. ENT:      Ears:       Nose: No congestion/rhinnorhea.      Mouth/Throat: Mucous membranes are moist. Surgical sites of tonsils are erythematous but no bleeding, no swelling, no drainage. No angioedema.  Neck: No stridor.    Cardiovascular: Normal rate, regular rhythm. Normal S1 and S2.  Good peripheral circulation. Respiratory: Normal respiratory effort without tachypnea or retractions. Lungs CTAB. Good air entry to the bases with no decreased or absent breath sounds. Musculoskeletal: Full range of motion to all extremities. No gross deformities appreciated. Neurologic:  Normal speech and language. No gross focal neurologic deficits are appreciated.  Skin:  Skin is warm, dry and intact. No rash noted. No visible wheals. Psychiatric: Mood and affect are normal. Speech and behavior are normal. Patient exhibits appropriate insight and judgement.   ____________________________________________   LABS (all labs ordered are listed, but only abnormal results are displayed)  Labs Reviewed - No data to display ____________________________________________  EKG   ____________________________________________  RADIOLOGY   No results found.  ____________________________________________    PROCEDURES  Procedure(s) performed:    Procedures    Medications  famotidine (PEPCID) 40 MG/5ML suspension 40 mg (40 mg Oral Given 12/13/16 2309)  dexamethasone (DECADRON) injection 20 mg  (20 mg Intramuscular Given 12/13/16 2237)  diphenhydrAMINE (BENADRYL) injection 50 mg (50 mg Intramuscular Given 12/13/16 2237)     ____________________________________________   INITIAL IMPRESSION / ASSESSMENT AND PLAN / ED COURSE  Pertinent labs & imaging results that were available during my care of the patient were reviewed by me and considered in my medical decision making (see chart for details).  Review of the [place] CSRS was performed in accordance of the [facility] prior to dispensing any controlled drugs.  Clinical Course     Patient's diagnosis is consistent with Allergic reaction. Patient presents after eating new foods today with generalized pruritus. No wheals. No angioedema. Exam is reassuring. Patient did have a tonsillectomy earlier today but there is no signs of infection, angioedema, or swelling, bleeding. Mother had been too significant events today with new food and tonsillectomy, I feel that this reaction is more likely food induced as symptoms began within 10 minutes of eating a new food. She was given famotidine, diphenhydramine, Decadron in the ER. Patient will be discharged home with prescriptions for oral solutions  of diphenhydramine and famotidine and prednisolone. Patient is to follow up with primary care or ENT surgeon as needed or otherwise directed. Patient is given ED precautions to return to the ED for any worsening or new symptoms.     ____________________________________________  FINAL CLINICAL IMPRESSION(S) / ED DIAGNOSES  Final diagnoses:  Allergic reaction, initial encounter  Pruritus      NEW MEDICATIONS STARTED DURING THIS VISIT:  New Prescriptions   DIPHENHYDRAMINE (BENYLIN) 12.5 MG/5ML SYRUP    Take 20 mLs (50 mg total) by mouth 4 (four) times daily as needed for itching.   FAMOTIDINE (PEPCID) 40 MG/5ML SUSPENSION    Take 2.5 mLs (20 mg total) by mouth daily.   PREDNISOLONE (ORAPRED) 15 MG/5ML SOLUTION    Take 20 mLs (60 mg total) by mouth  daily.        This chart was dictated using voice recognition software/Dragon. Despite best efforts to proofread, errors can occur which can change the meaning. Any change was purely unintentional.    Racheal PatchesJonathan D Rithwik Schmieg, PA-C 12/13/16 2315    Loleta Roseory Forbach, MD 12/14/16 [phone]

## 2016-12-13 NOTE — Transfer of Care (Signed)
Immediate Anesthesia Transfer of Care Note  Patient: Tom Branch  Procedure(s) Performed: Procedure(s): TONSILLECTOMY (N/A)  Patient Location: PACU  Anesthesia Type: General ETT  Level of Consciousness: awake, alert  and patient cooperative  Airway and Oxygen Therapy: Patient Spontanous Breathing and Patient connected to supplemental oxygen  Post-op Assessment: Post-op Vital signs reviewed, Patient's Cardiovascular Status Stable, Respiratory Function Stable, Patent Airway and No signs of Nausea or vomiting  Post-op Vital Signs: Reviewed and stable  Complications: No apparent anesthesia complications

## 2016-12-13 NOTE — ED Triage Notes (Signed)
Pt to triage via wheelchair. Pt ate chocolate pudding 15 minutes ago.  Now reports itching all over body.  Pt has tonsillectomy today by dr Andee Polesvaught.  No rash noted.  Pt took liquid benadryl.  Pt alert.

## 2016-12-13 NOTE — Op Note (Signed)
..  12/13/2016  12:10 PM    Nile DearAnderson, Everard  [id number]   Pre-Op Dx:  CHRONIC TONSILLITIS  Post-op Dx: CHRONIC TONSILLITIS  Proc:Tonsillectomy > age 21  Surg: [facility] Bowns  Anes:  General Endotracheal  EBL:  10ml  Comp:  None  Findings:  3+ cryptic tonsils, adenoids were absent so no adenoidectomy performed.  Procedure: After the patient was identified in holding and the history and physical and consent was reviewed, the patient was taken to the operating room and placed in a supine position.  General endotracheal anesthesia was induced in the normal fashion.  At this time, the patient was rotated 45 degrees and a shoulder roll was placed.  At this time, a McIvor mouthgag was inserted into the patient's oral cavity and suspended from the Mayo stand without injury to teeth, lips, or gums.  Next a red rubber catheter was inserted into the patient left nostril for retraction of the uvula and soft palate superiorly.  Next a curved Alice clamp was attached to the patient's right superior tonsillar pole and retracted medially and inferiorly.  A Bovie electrocautery was used to dissect the patient's right tonsil in a subcapsular plane.  Meticulous hemostasis was achieved with Bovie suction cautery.  At this time, the mouth gag was released from suspension for 1 minute.  Attention now was directed to the patient's left side.  In a similar fashion the curved Alice clamp was attached to the superior pole and this was retracted medially and inferiorly and the tonsil was excised in a subcapsular plane with Bovie electrocautery.  After completion of the second tonsil, meticulous hemostasis was continued.  At this time, attention was directed to the patient's Adenoidectomy.  Under indirect visualization using an operating mirror, the adenoid tissue was visualized and noted to be absent so no adenoidectomy was performed.  At this time, the patient's nasal cavity and oral cavity was irrigated  with sterile saline.  1.5 ml of  0.25% Marcaine was injected into the anterior and posterior tonsillar fossa bilaterally.  Following this  The care of patient was returned to anesthesia, awakened, and transferred to recovery in stable condition.  Dispo:  PACU to home  Plan: Soft diet.  Limit exercise and strenuous activity for 2 weeks.  Fluid hydration  Recheck my office three weeks.   Kaisyn Millea 12:10 PM 12/13/2016

## 2016-12-13 NOTE — H&P (Signed)
..  History and Physical paper copy reviewed and updated date of procedure and will be scanned into system.  

## 2016-12-14 ENCOUNTER — Encounter: Payer: Self-pay | Admitting: Otolaryngology

## 2016-12-15 LAB — SURGICAL PATHOLOGY

## 2017-02-22 DIAGNOSIS — M25521 Pain in right elbow: Secondary | ICD-10-CM | POA: Diagnosis not present

## 2017-02-22 DIAGNOSIS — J4520 Mild intermittent asthma, uncomplicated: Secondary | ICD-10-CM | POA: Diagnosis not present

## 2017-03-19 DIAGNOSIS — M7701 Medial epicondylitis, right elbow: Secondary | ICD-10-CM | POA: Diagnosis not present

## 2017-03-19 DIAGNOSIS — M25522 Pain in left elbow: Secondary | ICD-10-CM | POA: Diagnosis not present

## 2017-03-19 DIAGNOSIS — M7702 Medial epicondylitis, left elbow: Secondary | ICD-10-CM | POA: Diagnosis not present

## 2017-03-19 DIAGNOSIS — M25521 Pain in right elbow: Secondary | ICD-10-CM | POA: Diagnosis not present

## 2017-03-19 DIAGNOSIS — M249 Joint derangement, unspecified: Secondary | ICD-10-CM | POA: Diagnosis not present

## 2017-08-01 DIAGNOSIS — J029 Acute pharyngitis, unspecified: Secondary | ICD-10-CM | POA: Diagnosis not present

## 2017-08-09 DIAGNOSIS — R05 Cough: Secondary | ICD-10-CM | POA: Diagnosis not present

## 2017-08-09 DIAGNOSIS — K219 Gastro-esophageal reflux disease without esophagitis: Secondary | ICD-10-CM | POA: Diagnosis not present

## 2017-08-21 DIAGNOSIS — J4520 Mild intermittent asthma, uncomplicated: Secondary | ICD-10-CM | POA: Diagnosis not present

## 2017-08-21 DIAGNOSIS — M25521 Pain in right elbow: Secondary | ICD-10-CM | POA: Diagnosis not present

## 2017-09-12 DIAGNOSIS — R05 Cough: Secondary | ICD-10-CM | POA: Diagnosis not present

## 2017-09-12 DIAGNOSIS — J069 Acute upper respiratory infection, unspecified: Secondary | ICD-10-CM | POA: Diagnosis not present

## 2017-10-16 DIAGNOSIS — J4520 Mild intermittent asthma, uncomplicated: Secondary | ICD-10-CM | POA: Diagnosis not present

## 2017-10-16 DIAGNOSIS — Z0000 Encounter for general adult medical examination without abnormal findings: Secondary | ICD-10-CM | POA: Diagnosis not present

## 2017-10-25 ENCOUNTER — Encounter: Payer: Self-pay | Admitting: Emergency Medicine

## 2017-10-25 ENCOUNTER — Emergency Department
Admission: EM | Admit: 2017-10-25 | Discharge: 2017-10-25 | Disposition: A | Discharge status: Home / Self Care | Payer: 59 | Attending: Emergency Medicine | Admitting: Emergency Medicine

## 2017-10-25 DIAGNOSIS — T7840XA Allergy, unspecified, initial encounter: Secondary | ICD-10-CM | POA: Diagnosis not present

## 2017-10-25 DIAGNOSIS — Z79899 Other long term (current) drug therapy: Secondary | ICD-10-CM | POA: Diagnosis not present

## 2017-10-25 DIAGNOSIS — Z91040 Latex allergy status: Secondary | ICD-10-CM | POA: Insufficient documentation

## 2017-10-25 DIAGNOSIS — L299 Pruritus, unspecified: Secondary | ICD-10-CM | POA: Diagnosis present

## 2017-10-25 MED ORDER — PREDNISONE 20 MG PO TABS
60.0000 mg | ORAL_TABLET | Freq: Once | ORAL | Status: AC
  Administered 2017-10-25: 60 mg via ORAL
  Filled 2017-10-25: qty 3

## 2017-10-25 MED ORDER — DIPHENHYDRAMINE HCL 25 MG PO CAPS
25.0000 mg | ORAL_CAPSULE | Freq: Once | ORAL | Status: AC
  Administered 2017-10-25: 25 mg via ORAL
  Filled 2017-10-25: qty 1

## 2017-10-25 MED ORDER — PREDNISONE 20 MG PO TABS: 60.0000 mg | ORAL_TABLET | Freq: Every day | ORAL | 0 refills | Status: AC

## 2017-10-25 MED ORDER — FAMOTIDINE 20 MG PO TABS
20.0000 mg | ORAL_TABLET | Freq: Once | ORAL | Status: AC
  Administered 2017-10-25: 20 mg via ORAL
  Filled 2017-10-25: qty 1

## 2017-10-25 NOTE — ED Triage Notes (Addendum)
Patient ambulatory to triage with steady gait, without difficulty; pt very talkative and appears anxious; pt reports generalized itching; took 1 benadryl PTA with some relief; st seen for same several times with unknown cause; no rashes/hives noted

## 2017-10-25 NOTE — ED Notes (Signed)
Pt to the er for allergic reaction. Pt reports difficulty breathing and swallowing when it happened. Pt reports a bite on his right leg sometime tonight. Pt reports rash allover earlier but none now. Mouth is clear. No swelling noted, lungs are clear. Tender to abdomen with palpation all over. Denies smoking or drugs. Benadryl  Taken at [employer].

## 2017-10-25 NOTE — ED Notes (Signed)
Family at bedside. 

## 2017-10-25 NOTE — ED Provider Notes (Signed)
[facility] Emergency Department Provider Note   First MD Initiated Contact with Patient 10/25/17 0102     (approximate)  I have reviewed the triage vital signs and the nursing notes.   HISTORY  Chief Complaint Allergic Reaction    HPI Tom Branch is a 21 y.o. male with history of multiple known allergies from various allergens presents to the emergency department with acute onset of generalized pruritus which began at midnight. Patient denies any dyspnea no difficulty swallowing. Patient states that he took one tablet of Benadryl with resolution of symptoms. Patient unsure as to what caused his pruritus tonight   Past Medical History:  Diagnosis Date  . ADHD   . Adopted    no family medical hx  . Kidney stones     There are no active problems to display for this patient.   Past Surgical History:  Procedure Laterality Date  . LITHOTRIPSY    . TONSILLECTOMY N/A 12/13/2016   Procedure: TONSILLECTOMY;  Surgeon: Bud Face, MD;  Location: [facility];  Service: ENT;  Laterality: N/A;  . WISDOM TOOTH EXTRACTION      Prior to Admission medications   Medication Sig Start Date End Date Taking? Authorizing Provider  diphenhydrAMINE (BENYLIN) 12.5 MG/5ML syrup Take 20 mLs (50 mg total) by mouth 4 (four) times daily as needed for itching. 12/13/16   Cuthriell, Delorise Royals, PA-C  EPINEPHrine (EPIPEN 2-PAK) 0.3 mg/0.3 mL IJ SOAJ injection Inject 0.3 mLs (0.3 mg total) into the muscle once. 02/09/16   Rebecka Apley, MD  famotidine (PEPCID) 40 MG/5ML suspension Take 2.5 mLs (20 mg total) by mouth daily. 12/13/16 12/23/16  Cuthriell, Delorise Royals, PA-C  lidocaine (XYLOCAINE) 2 % solution Use as directed 10 mLs in the mouth or throat every 6 (six) hours as needed for mouth pain (Swish and spit). 12/13/16   Bud Face, MD  methylphenidate 36 MG PO CR tablet Take 36 mg by mouth daily.    [provider]  Omega-3 Fatty Acids (FISH  OIL PO) Take by mouth daily.    [provider]  oxyCODONE (ROXICODONE) 5 MG/5ML solution Take 10 mLs (10 mg total) by mouth every 4 (four) hours as needed for severe pain. 12/13/16   Bud Face, MD  prednisoLONE (ORAPRED) 15 MG/5ML solution Take 20 mLs (60 mg total) by mouth daily. 12/13/16 12/13/17  Cuthriell, Delorise Royals, PA-C  promethazine (PHENERGAN) 12.5 MG tablet Take 1 tablet (12.5 mg total) by mouth every 6 (six) hours as needed for nausea or vomiting. 12/13/16   Bud Face, MD  Vitamin D, Ergocalciferol, (DRISDOL) 50000 units CAPS capsule Take 50,000 Units by mouth every 7 (seven) days.    [provider]    Allergies Dilaudid [hydromorphone hcl]; Latex; and Meat extract  No family history on file.  Social History Social History  Substance Use Topics  . Smoking status: Never Smoker  . Smokeless tobacco: Never Used  . Alcohol use No    Review of Systems Constitutional: No fever/chills Eyes: No visual changes. ENT: No sore throat. Cardiovascular: Denies chest pain. Respiratory: Denies shortness of breath. Gastrointestinal: No abdominal pain.  No nausea, no vomiting.  No diarrhea.  No constipation. Genitourinary: Negative for dysuria. Musculoskeletal: Negative for neck pain.  Negative for back pain. Integumentary: Negative for rash. Positive for generalized pruritus Neurological: Negative for headaches, focal weakness or numbness.   ____________________________________________   PHYSICAL EXAM:  VITAL SIGNS: ED Triage Vitals  Enc Vitals Group  BP 10/25/17 0028 (!) 121/93     Pulse Rate 10/25/17 0028 100     Resp 10/25/17 0028 (!) 2     Temp 10/25/17 0028 98 F (36.7 C)     Temp src --      SpO2 10/25/17 0028 97 %     Weight 10/25/17 0027 77.1 kg (170 lb)     Height 10/25/17 0027 1.702 m (5\' 7" )     Head Circumference --      Peak Flow --      Pain Score --      Pain Loc --      Pain Edu? --      Excl. in GC? --      Constitutional: Alert and oriented. Well appearing and in no acute distress. Eyes: Conjunctivae are normal. Head: Atraumatic. Mouth/Throat: Mucous membranes are moist.  Oropharynx non-erythematous. Neck: No stridor.   Cardiovascular: Normal rate, regular rhythm. Good peripheral circulation. Grossly normal heart sounds. Respiratory: Normal respiratory effort.  No retractions. Lungs CTAB. Gastrointestinal: Soft and nontender. No distention.  Musculoskeletal: No lower extremity tenderness nor edema. No gross deformities of extremities. Neurologic:  Normal speech and language. No gross focal neurologic deficits are appreciated.  Skin:  Skin is warm, dry and intact. No rash noted. Noted excoriations on the patient's back Psychiatric: Mood and affect are normal. Speech and behavior are normal.    Procedures   ____________________________________________   INITIAL IMPRESSION / ASSESSMENT AND PLAN / ED COURSE  As part of my medical decision making, I reviewed the following data within the electronic MEDICAL RECORD NUMBER 21 year old male presenting with above stated history of physical exam consistent with acute allergic reaction secondary to unknown allergen. Patient given prednisone 60 mg Benadryl 25 mg and Pepcid 20 mg by mouth in the emergency department. Patient has EpiPen with him.    ____________________________________________  FINAL CLINICAL IMPRESSION(S) / ED DIAGNOSES  Final diagnoses:  Allergic reaction, initial encounter     MEDICATIONS GIVEN DURING THIS VISIT:  Medications  diphenhydrAMINE (BENADRYL) capsule 25 mg (25 mg Oral Given 10/25/17 0121)  predniSONE (DELTASONE) tablet 60 mg (60 mg Oral Given 10/25/17 0121)  famotidine (PEPCID) tablet 20 mg (20 mg Oral Given 10/25/17 0121)     NEW OUTPATIENT MEDICATIONS STARTED DURING THIS VISIT:  New Prescriptions   No medications on file    Modified Medications   No medications on file    Discontinued Medications    No medications on file     Note:  This document was prepared using Dragon voice recognition software and may include unintentional dictation errors.    Darci CurrentBrown, Elgin N, MD 10/25/17 [phone]

## 2017-10-25 NOTE — ED Notes (Signed)
Pt reports feeling good except for being tired.

## 2018-01-04 DIAGNOSIS — J019 Acute sinusitis, unspecified: Secondary | ICD-10-CM | POA: Diagnosis not present

## 2018-08-21 ENCOUNTER — Other Ambulatory Visit: Payer: Self-pay

## 2018-08-21 ENCOUNTER — Emergency Department
Admission: EM | Admit: 2018-08-21 | Discharge: 2018-08-22 | Disposition: A | Discharge status: Home / Self Care | Payer: 59 | Attending: Emergency Medicine | Admitting: Emergency Medicine

## 2018-08-21 ENCOUNTER — Encounter: Payer: Self-pay | Admitting: Emergency Medicine

## 2018-08-21 DIAGNOSIS — Z91040 Latex allergy status: Secondary | ICD-10-CM | POA: Diagnosis not present

## 2018-08-21 DIAGNOSIS — Z79899 Other long term (current) drug therapy: Secondary | ICD-10-CM | POA: Diagnosis not present

## 2018-08-21 DIAGNOSIS — T7840XA Allergy, unspecified, initial encounter: Secondary | ICD-10-CM | POA: Diagnosis not present

## 2018-08-21 DIAGNOSIS — Z91018 Allergy to other foods: Secondary | ICD-10-CM | POA: Insufficient documentation

## 2018-08-21 DIAGNOSIS — T782XXA Anaphylactic shock, unspecified, initial encounter: Secondary | ICD-10-CM | POA: Insufficient documentation

## 2018-08-21 DIAGNOSIS — R21 Rash and other nonspecific skin eruption: Secondary | ICD-10-CM | POA: Diagnosis present

## 2018-08-21 MED ORDER — DIPHENHYDRAMINE HCL 50 MG/ML IJ SOLN
25.0000 mg | Freq: Once | INTRAMUSCULAR | Status: AC
Start: 2018-08-21 — End: 2018-08-21
  Administered 2018-08-21: 25 mg via INTRAVENOUS
  Filled 2018-08-21: qty 1

## 2018-08-21 MED ORDER — FAMOTIDINE IN NACL 20-0.9 MG/50ML-% IV SOLN
20.0000 mg | Freq: Once | INTRAVENOUS | Status: AC
  Administered 2018-08-21: 20 mg via INTRAVENOUS
  Filled 2018-08-21: qty 50

## 2018-08-21 MED ORDER — EPINEPHRINE 0.3 MG/0.3ML IJ SOAJ
INTRAMUSCULAR | Status: AC
  Administered 2018-08-21: 0.3 mg via INTRAMUSCULAR
  Filled 2018-08-21: qty 0.3

## 2018-08-21 MED ORDER — METHYLPREDNISOLONE SODIUM SUCC 125 MG IJ SOLR
125.0000 mg | Freq: Once | INTRAMUSCULAR | Status: AC
  Administered 2018-08-21: 125 mg via INTRAVENOUS
  Filled 2018-08-21: qty 2

## 2018-08-21 MED ORDER — EPINEPHRINE 0.3 MG/0.3ML IJ SOAJ
0.3000 mg | Freq: Once | INTRAMUSCULAR | Status: AC
  Administered 2018-08-21: 0.3 mg via INTRAMUSCULAR

## 2018-08-21 NOTE — ED Provider Notes (Addendum)
[facility] Emergency Department Provider Note   ____________________________________________   First MD Initiated Contact with Patient 08/21/18 2159     (approximate)  I have reviewed the triage vital signs and the nursing notes.   HISTORY  Chief Complaint Allergic Reaction   HPI Tom Branch is a 22 y.o. male who comes in complaining of itchy rash all over and some trouble breathing.  He has hives.  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  It does not know anything else that it could be that would be causing the allergy.  Patient reports he had a tick bite by his umbilicus sometime ago and this is swollen up today when all the rest of this started.  Past Medical History:  Diagnosis Date  . ADHD   . Adopted    no family medical hx  . Kidney stones     There are no active problems to display for this patient.   Past Surgical History:  Procedure Laterality Date  . LITHOTRIPSY    . TONSILLECTOMY N/A 12/13/2016   Procedure: TONSILLECTOMY;  Surgeon: Bud Face, MD;  Location: [facility];  Service: ENT;  Laterality: N/A;  . WISDOM TOOTH EXTRACTION      Prior to Admission medications   Medication Sig Start Date End Date Taking? Authorizing Provider  diphenhydrAMINE (BENYLIN) 12.5 MG/5ML syrup Take 20 mLs (50 mg total) by mouth 4 (four) times daily as needed for itching. 12/13/16   Cuthriell, Delorise Royals, PA-C  EPINEPHrine (EPIPEN 2-PAK) 0.3 mg/0.3 mL IJ SOAJ injection Inject 0.3 mLs (0.3 mg total) into the muscle once. 02/09/16   Rebecka Apley, MD  famotidine (PEPCID) 40 MG/5ML suspension Take 2.5 mLs (20 mg total) by mouth daily. 12/13/16 12/23/16  Cuthriell, Delorise Royals, PA-C  lidocaine (XYLOCAINE) 2 % solution Use as directed 10 mLs in the mouth or throat every 6 (six) hours as needed for mouth  pain (Swish and spit). 12/13/16   Bud Face, MD  methylphenidate 36 MG PO CR tablet Take 36 mg by mouth daily.    [provider]  Omega-3 Fatty Acids (FISH OIL PO) Take by mouth daily.    [provider]  oxyCODONE (ROXICODONE) 5 MG/5ML solution Take 10 mLs (10 mg total) by mouth every 4 (four) hours as needed for severe pain. 12/13/16   Bud Face, MD  promethazine (PHENERGAN) 12.5 MG tablet Take 1 tablet (12.5 mg total) by mouth every 6 (six) hours as needed for nausea or vomiting. 12/13/16   Bud Face, MD  Vitamin D, Ergocalciferol, (DRISDOL) 50000 units CAPS capsule Take 50,000 Units by mouth every 7 (seven) days.    [provider]    Allergies Dilaudid [hydromorphone hcl]; Latex; Meat extract; and Other  No family history on file.  Social History Social History   Tobacco Use  . Smoking status: Never Smoker  . Smokeless tobacco: Never Used  Substance Use Topics  . Alcohol use: No  . Drug use: No    Review of Systems  Constitutional: No fever/chills Eyes: No visual changes. ENT: No sore throat. Cardiovascular: Denies chest pain. Respiratory:  shortness of breath. Gastrointestinal: No abdominal pain.  No nausea, no vomiting.  No diarrhea.  No constipation. Genitourinary: Negative for dysuria. Musculoskeletal: Negative for back pain. Skin:rash. Neurological: Negative for headaches, focal weakness  ____________________________________________   PHYSICAL EXAM:  VITAL SIGNS: ED Triage Vitals [08/21/18 2156]  Enc Vitals Group     BP (!) 147/85     Pulse Rate 90     Resp 20     Temp 98.1 F (36.7 C)     Temp Source Oral     SpO2 97 %     Weight 164 lb (74.4 kg)     Height 5\' 7"  (1.702 m)     Head Circumference      Peak Flow      Pain Score 0     Pain Loc      Pain Edu?      Excl. in GC?     Constitutional: Alert and oriented.  Itching Eyes: Conjunctivae are normal.  Head: Atraumatic. Nose: No  congestion/rhinnorhea. Mouth/Throat: Mucous membranes are moist.  Oropharynx non-erythematous. Neck: No stridor.  Cardiovascular: Normal rate, regular rhythm. Grossly normal heart sounds.  Good peripheral circulation. Respiratory: Normal respiratory effort.  No retractions. Lungs CTAB.  Patient reports shortness of breath subjectively Gastrointestinal: Soft and nontender. No distention. No abdominal bruits. No CVA tenderness. Musculoskeletal: No lower extremity tenderness nor edema.   Neurologic:  Normal speech and language. No gross focal neurologic deficits are appreciated.  Skin:  Skin is warm, dry and intact.  Hives diffusely Psychiatric: Mood and affect are normal. Speech and behavior are normal.  ____________________________________________   LABS (all labs ordered are listed, but only abnormal results are displayed)  Labs Reviewed - No data to display ____________________________________________  EKG   ____________________________________________  RADIOLOGY  ED MD interpretatio  Official radiology report(s): No results found.  ____________________________________________   PROCEDURES  Procedure(s) performed:   Procedures  Critical Care performed:  Critical time 15 minutes including intial evaluation and repeat eval x 3 ____________________________________________   INITIAL IMPRESSION / ASSESSMENT AND PLAN / ED COURSE  ----------------------------------------- 11:24 PM on 08/21/2018 -----------------------------------------  Patient is much better.  I will sign him out to Dr. Lamont Snowballifenbark so he can monitor his continued well-being.  Anticipate discharge with diagnosis of allergic reaction         ____________________________________________   FINAL CLINICAL IMPRESSION(S) / ED DIAGNOSES  Final diagnoses:  Allergic reaction, initial encounter     ED Discharge Orders    None       Note:  This document was prepared using Dragon voice  recognition software and may include unintentional dictation errors.    Arnaldo NatalMalinda, Alizzon Dioguardi F, MD 08/21/18 2325    Arnaldo NatalMalinda, Rosevelt Luu F, MD 08/21/18 2325

## 2018-08-21 NOTE — ED Triage Notes (Signed)
Patient to ER for c/o allergic reaction (possibly meat allergy). Patient states he has had problems with the same before, had burger earlier today. Patient with itching and hives. +Shob.

## 2018-08-22 MED ORDER — PREDNISONE 50 MG PO TABS: 50.0000 mg | ORAL_TABLET | Freq: Every day | ORAL | 0 refills | Status: AC

## 2018-08-22 NOTE — ED Provider Notes (Signed)
The patient is currently asymptomatic.  We will give steroids for another 4 days.  Does not need a refill of EpiPen.  Discharged home in improved condition.   Merrily Brittleifenbark, Donyel Castagnola, MD 08/22/18 [phone]

## 2018-08-22 NOTE — Discharge Instructions (Signed)
Please take your steroids for the next 4 days to help prevent a recurrence of your symptoms.  Instead of using Benadryl you can take over-the-counter Zyrtec (cetirizine) 10 mg up to 4 times a day to help with itching and symptomatic control.  If you ever use an EpiPen on yourself in the future it is critically important that you come back to the emergency department and get further evaluated.  Return to the ER for any issues whatsoever.  It was a pleasure to take care of you today, and thank you for coming to our emergency department.  If you have any questions or concerns before leaving please ask the nurse to grab me and I'm more than happy to go through your aftercare instructions again.  If you were prescribed any opioid pain medication today such as Norco, Vicodin, Percocet, morphine, hydrocodone, or oxycodone please make sure you do not drive when you are taking this medication as it can alter your ability to drive safely.  If you have any concerns once you are home that you are not improving or are in fact getting worse before you can make it to your follow-up appointment, please do not hesitate to call 911 and come back for further evaluation.  Merrily BrittleNeil Aaminah Forrester, MD

## 2018-08-30 DIAGNOSIS — R1013 Epigastric pain: Secondary | ICD-10-CM | POA: Diagnosis not present

## 2018-09-10 DIAGNOSIS — Z9189 Other specified personal risk factors, not elsewhere classified: Secondary | ICD-10-CM | POA: Diagnosis not present

## 2018-09-10 DIAGNOSIS — M954 Acquired deformity of chest and rib: Secondary | ICD-10-CM | POA: Diagnosis not present

## 2018-09-10 DIAGNOSIS — J4520 Mild intermittent asthma, uncomplicated: Secondary | ICD-10-CM | POA: Diagnosis not present

## 2018-09-17 DIAGNOSIS — G5623 Lesion of ulnar nerve, bilateral upper limbs: Secondary | ICD-10-CM | POA: Diagnosis not present

## 2018-09-20 DIAGNOSIS — R200 Anesthesia of skin: Secondary | ICD-10-CM | POA: Diagnosis not present

## 2018-10-02 DIAGNOSIS — G5621 Lesion of ulnar nerve, right upper limb: Secondary | ICD-10-CM | POA: Diagnosis not present

## 2018-10-02 DIAGNOSIS — G5622 Lesion of ulnar nerve, left upper limb: Secondary | ICD-10-CM | POA: Diagnosis not present

## 2018-10-07 ENCOUNTER — Other Ambulatory Visit: Payer: Self-pay

## 2018-10-07 ENCOUNTER — Encounter: Payer: Self-pay | Admitting: *Deleted

## 2018-10-09 ENCOUNTER — Ambulatory Visit
Admission: RE | Admit: 2018-10-09 | Discharge: 2018-10-09 | Disposition: A | Discharge status: Home / Self Care | Payer: 59 | Source: Ambulatory Visit | Attending: Surgery | Admitting: Surgery

## 2018-10-09 ENCOUNTER — Encounter
Admission: RE | Disposition: A | Discharge status: Home / Self Care | Payer: Self-pay | Source: Ambulatory Visit | Attending: Surgery

## 2018-10-09 ENCOUNTER — Ambulatory Visit: Payer: 59 | Admitting: Anesthesiology

## 2018-10-09 DIAGNOSIS — Z885 Allergy status to narcotic agent status: Secondary | ICD-10-CM | POA: Diagnosis not present

## 2018-10-09 DIAGNOSIS — Z823 Family history of stroke: Secondary | ICD-10-CM | POA: Insufficient documentation

## 2018-10-09 DIAGNOSIS — G5623 Lesion of ulnar nerve, bilateral upper limbs: Secondary | ICD-10-CM | POA: Insufficient documentation

## 2018-10-09 DIAGNOSIS — G5622 Lesion of ulnar nerve, left upper limb: Secondary | ICD-10-CM | POA: Diagnosis not present

## 2018-10-09 DIAGNOSIS — F909 Attention-deficit hyperactivity disorder, unspecified type: Secondary | ICD-10-CM | POA: Insufficient documentation

## 2018-10-09 DIAGNOSIS — Z79899 Other long term (current) drug therapy: Secondary | ICD-10-CM | POA: Insufficient documentation

## 2018-10-09 DIAGNOSIS — J45909 Unspecified asthma, uncomplicated: Secondary | ICD-10-CM | POA: Insufficient documentation

## 2018-10-09 DIAGNOSIS — Z91040 Latex allergy status: Secondary | ICD-10-CM | POA: Insufficient documentation

## 2018-10-09 DIAGNOSIS — G8918 Other acute postprocedural pain: Secondary | ICD-10-CM | POA: Diagnosis not present

## 2018-10-09 DIAGNOSIS — Z91018 Allergy to other foods: Secondary | ICD-10-CM | POA: Insufficient documentation

## 2018-10-09 DIAGNOSIS — M25522 Pain in left elbow: Secondary | ICD-10-CM | POA: Diagnosis not present

## 2018-10-09 HISTORY — DX: Unspecified asthma, uncomplicated: J45.909

## 2018-10-09 HISTORY — PX: ULNAR NERVE TRANSPOSITION: SHX2595

## 2018-10-09 HISTORY — DX: Presence of spectacles and contact lenses: Z97.3

## 2018-10-09 SURGERY — ULNAR NERVE DECOMPRESSION/TRANSPOSITION
Anesthesia: Regional | Site: Elbow | Laterality: Left

## 2018-10-09 MED ORDER — ACETAMINOPHEN 160 MG/5ML PO SOLN: 325.0000 mg | ORAL | Status: DC | PRN

## 2018-10-09 MED ORDER — POTASSIUM CHLORIDE IN NACL 20-0.9 MEQ/L-% IV SOLN: INTRAVENOUS | Status: DC

## 2018-10-09 MED ORDER — OXYCODONE HCL 5 MG/5ML PO SOLN: 5.0000 mg | Freq: Once | ORAL | Status: DC | PRN

## 2018-10-09 MED ORDER — ONDANSETRON HCL 4 MG/2ML IJ SOLN: 4.0000 mg | Freq: Four times a day (QID) | INTRAMUSCULAR | Status: DC | PRN

## 2018-10-09 MED ORDER — PROPOFOL 500 MG/50ML IV EMUL
INTRAVENOUS | Status: DC | PRN
  Administered 2018-10-09: 75 ug/kg/min via INTRAVENOUS

## 2018-10-09 MED ORDER — ROPIVACAINE HCL 5 MG/ML IJ SOLN
INTRAMUSCULAR | Status: DC | PRN
  Administered 2018-10-09: 35 mL via EPIDURAL

## 2018-10-09 MED ORDER — FENTANYL CITRATE (PF) 100 MCG/2ML IJ SOLN
25.0000 ug | INTRAMUSCULAR | Status: DC | PRN
  Administered 2018-10-09: 100 ug via INTRAVENOUS

## 2018-10-09 MED ORDER — METOCLOPRAMIDE HCL 5 MG PO TABS: 5.0000 mg | ORAL_TABLET | Freq: Three times a day (TID) | ORAL | Status: DC | PRN

## 2018-10-09 MED ORDER — ONDANSETRON HCL 4 MG PO TABS: 4.0000 mg | ORAL_TABLET | Freq: Four times a day (QID) | ORAL | Status: DC | PRN

## 2018-10-09 MED ORDER — OXYCODONE HCL 5 MG PO TABS: 5.0000 mg | ORAL_TABLET | ORAL | 0 refills | Status: DC | PRN

## 2018-10-09 MED ORDER — OXYCODONE HCL 5 MG PO TABS: 5.0000 mg | ORAL_TABLET | ORAL | Status: DC | PRN

## 2018-10-09 MED ORDER — ACETAMINOPHEN 325 MG PO TABS: 325.0000 mg | ORAL_TABLET | ORAL | Status: DC | PRN

## 2018-10-09 MED ORDER — LACTATED RINGERS IV SOLN
10.0000 mL/h | INTRAVENOUS | Status: DC
  Administered 2018-10-09: 10 mL/h via INTRAVENOUS

## 2018-10-09 MED ORDER — PROMETHAZINE HCL 25 MG/ML IJ SOLN: 6.2500 mg | INTRAMUSCULAR | Status: DC | PRN

## 2018-10-09 MED ORDER — METOCLOPRAMIDE HCL 5 MG/ML IJ SOLN: 5.0000 mg | Freq: Three times a day (TID) | INTRAMUSCULAR | Status: DC | PRN

## 2018-10-09 MED ORDER — PROPOFOL 10 MG/ML IV BOLUS
INTRAVENOUS | Status: DC | PRN
  Administered 2018-10-09: 20 mg via INTRAVENOUS
  Administered 2018-10-09: 50 mg via INTRAVENOUS
  Administered 2018-10-09: 20 mg via INTRAVENOUS

## 2018-10-09 MED ORDER — DEXMEDETOMIDINE HCL 200 MCG/2ML IV SOLN
INTRAVENOUS | Status: DC | PRN
  Administered 2018-10-09: 8 ug via INTRAVENOUS

## 2018-10-09 MED ORDER — OXYCODONE HCL 5 MG PO TABS: 5.0000 mg | ORAL_TABLET | Freq: Once | ORAL | Status: DC | PRN

## 2018-10-09 MED ORDER — DEXTROSE 5 % IV SOLN
2000.0000 mg | Freq: Once | INTRAVENOUS | Status: AC
  Administered 2018-10-09: 2000 mg via INTRAVENOUS

## 2018-10-09 MED ORDER — MIDAZOLAM HCL 5 MG/5ML IJ SOLN
INTRAMUSCULAR | Status: DC | PRN
  Administered 2018-10-09: 2 mg via INTRAVENOUS

## 2018-10-09 MED ORDER — DEXAMETHASONE SODIUM PHOSPHATE 4 MG/ML IJ SOLN
INTRAMUSCULAR | Status: DC | PRN
  Administered 2018-10-09: 4 mg via INTRAVENOUS

## 2018-10-09 MED ORDER — LIDOCAINE HCL (CARDIAC) PF 100 MG/5ML IV SOSY
PREFILLED_SYRINGE | INTRAVENOUS | Status: DC | PRN
  Administered 2018-10-09: 40 mg via INTRAVENOUS

## 2018-10-09 MED ORDER — ONDANSETRON HCL 4 MG/2ML IJ SOLN
INTRAMUSCULAR | Status: DC | PRN
  Administered 2018-10-09: 4 mg via INTRAVENOUS

## 2018-10-09 SURGICAL SUPPLY — 35 items
BANDAGE ELASTIC 3 LF NS (GAUZE/BANDAGES/DRESSINGS) ×3 IMPLANT
BANDAGE ELASTIC 4 LF NS (GAUZE/BANDAGES/DRESSINGS) ×3 IMPLANT
BNDG COHESIVE 4X5 TAN STRL (GAUZE/BANDAGES/DRESSINGS) ×3 IMPLANT
BNDG ESMARK 4X12 TAN STRL LF (GAUZE/BANDAGES/DRESSINGS) ×3 IMPLANT
CANISTER SUCT 1200ML W/VALVE (MISCELLANEOUS) ×3 IMPLANT
CHLORAPREP W/TINT 26ML (MISCELLANEOUS) ×3 IMPLANT
CORD BIP STRL DISP 12FT (MISCELLANEOUS) ×3 IMPLANT
COVER LIGHT HANDLE UNIVERSAL (MISCELLANEOUS) ×6 IMPLANT
DRAPE U-SHAPE 48X52 POLY STRL (PACKS) ×3 IMPLANT
ELECT REM PT RETURN 9FT ADLT (ELECTROSURGICAL) ×3
ELECTRODE REM PT RTRN 9FT ADLT (ELECTROSURGICAL) ×1 IMPLANT
GAUZE PETRO XEROFOAM 1X8 (MISCELLANEOUS) ×3 IMPLANT
GAUZE SPONGE 4X4 12PLY STRL (GAUZE/BANDAGES/DRESSINGS) ×3 IMPLANT
GLOVE BIOGEL PI IND STRL 7.5 (GLOVE) ×3 IMPLANT
GLOVE BIOGEL PI IND STRL 8 (GLOVE) ×1 IMPLANT
GLOVE BIOGEL PI INDICATOR 7.5 (GLOVE) ×6
GLOVE BIOGEL PI INDICATOR 8 (GLOVE) ×2
GLOVE PI ULTRA LF STRL 7.5 (GLOVE) ×2 IMPLANT
GLOVE PI ULTRA NON LATEX 7.5 (GLOVE) ×4
GOWN STRL REUS W/ TWL LRG LVL3 (GOWN DISPOSABLE) ×2 IMPLANT
GOWN STRL REUS W/ TWL XL LVL3 (GOWN DISPOSABLE) ×1 IMPLANT
GOWN STRL REUS W/TWL LRG LVL3 (GOWN DISPOSABLE) ×4
GOWN STRL REUS W/TWL XL LVL3 (GOWN DISPOSABLE) ×2
KIT TURNOVER KIT A (KITS) ×3 IMPLANT
LOOP VESSEL RED MINI 1.3X0.9 (MISCELLANEOUS) ×1 IMPLANT
LOOPS RED MINI 1.3MMX0.9MM (MISCELLANEOUS) ×2
NS IRRIG 500ML POUR BTL (IV SOLUTION) ×3 IMPLANT
PACK EXTREMITY ARMC (MISCELLANEOUS) ×3 IMPLANT
PENCIL SMOKE EVACUATOR (MISCELLANEOUS) ×3 IMPLANT
SLING ARM M TX990204 (SOFTGOODS) ×3 IMPLANT
STOCKINETTE IMPERVIOUS LG (DRAPES) ×3 IMPLANT
SUT VIC AB 2-0 CT1 27 (SUTURE) ×2
SUT VIC AB 2-0 CT1 TAPERPNT 27 (SUTURE) ×1 IMPLANT
SUT VIC AB 3-0 SH 27 (SUTURE) ×4
SUT VIC AB 3-0 SH 27X BRD (SUTURE) ×2 IMPLANT

## 2018-10-09 NOTE — Anesthesia Postprocedure Evaluation (Signed)
Anesthesia Post Note  Patient: Tom Branch  Procedure(s) Performed: SUBCUTANEOUS TRANSPOSITION OF THE ULNAR NERVE OF LEFT ELBOW (Left Elbow)  Patient location during evaluation: PACU Anesthesia Type: Regional Level of consciousness: awake and alert Pain management: pain level controlled Vital Signs Assessment: post-procedure vital signs reviewed and stable Respiratory status: spontaneous breathing Cardiovascular status: blood pressure returned to baseline Anesthetic complications: no    Verner Chol, III,  Lacresha Fusilier D

## 2018-10-09 NOTE — Anesthesia Procedure Notes (Signed)
Procedure Name: MAC Date/Time: 10/09/2018 11:13 AM Performed by: Janna Arch, CRNA Pre-anesthesia Checklist: Patient identified, Emergency Drugs available, Suction available, Patient being monitored and Timeout performed Patient Re-evaluated:Patient Re-evaluated prior to induction Oxygen Delivery Method: Simple face mask Placement Confirmation: positive ETCO2 and breath sounds checked- equal and bilateral

## 2018-10-09 NOTE — Anesthesia Preprocedure Evaluation (Signed)
Anesthesia Evaluation  Patient identified by MRN, date of birth, ID band Patient awake    Reviewed: Allergy & Precautions, H&P , NPO status , Patient's Chart, lab work & pertinent test results  Airway Mallampati: II  TM Distance: >3 FB Neck ROM: full    Dental no notable dental hx.    Pulmonary asthma ,    Pulmonary exam normal        Cardiovascular negative cardio ROS Normal cardiovascular exam     Neuro/Psych    GI/Hepatic negative GI ROS, Neg liver ROS,   Endo/Other  negative endocrine ROS  Renal/GU      Musculoskeletal   Abdominal   Peds  Hematology negative hematology ROS (+)   Anesthesia Other Findings   Reproductive/Obstetrics negative OB ROS                             Anesthesia Physical Anesthesia Plan  ASA: II  Anesthesia Plan: General and Regional   Post-op Pain Management: GA combined w/ Regional for post-op pain   Induction:   PONV Risk Score and Plan:   Airway Management Planned:   Additional Equipment:   Intra-op Plan:   Post-operative Plan:   Informed Consent: I have reviewed the patients History and Physical, chart, labs and discussed the procedure including the risks, benefits and alternatives for the proposed anesthesia with the patient or authorized representative who has indicated his/her understanding and acceptance.     Plan Discussed with:   Anesthesia Plan Comments:         Anesthesia Quick Evaluation

## 2018-10-09 NOTE — H&P (Signed)
Paper H&P to be scanned into permanent record. H&P reviewed and patient re-examined. No changes. 

## 2018-10-09 NOTE — Anesthesia Procedure Notes (Signed)
Anesthesia Regional Block: Supraclavicular block   Pre-Anesthetic Checklist: ,, timeout performed, Correct Patient, Correct Site, Correct Laterality, Correct Procedure, Correct Position, site marked, Risks and benefits discussed,  Surgical consent,  Pre-op evaluation,  At surgeon's request and post-op pain management  Laterality: Left  Prep: chloraprep       Needles:  Injection technique: Single-shot  Needle Type: Echogenic Stimulator Needle      Needle Gauge: 21     Additional Needles:   Procedures:, nerve stimulator,,, ultrasound used (permanent image in chart),,,,   Nerve Stimulator or Paresthesia:  Response: bicep contraction, 0.45 mA,   Additional Responses:   Narrative:  Start time: 10/09/2018 10:26 AM End time: 10/09/2018 10:34 AM Injection made incrementally with aspirations every 5 mL.  Performed by: Personally  Anesthesiologist: Jolayne Panther, MD  Additional Notes: Functioning IV was confirmed and monitors applied.  Sterile prep and drape,hand hygiene and sterile gloves were used.Ultrasound guidance: relevant anatomy identified, needle position confirmed, local anesthetic spread visualized around nerve(s)., vascular puncture avoided.  Image printed for medical record.  Negative aspiration and negative test dose prior to incremental administration of local anesthetic. The patient tolerated the procedure well. Vitals signes recorded in RN notes.

## 2018-10-09 NOTE — Op Note (Signed)
10/09/2018  12:38 PM  Patient:   Tom Branch  Pre-Op Diagnosis:   Left cubital tunnel syndrome.  Post-Op Diagnosis:   Same  Procedure:   Subcutaneous anterior transposition ulnar nerve, left elbow.  Surgeon:   Maryagnes Amos, MD  Assistant:   None  Anesthesia:   Interscalene block with IV sedation  Findings:   As above.  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 The patient's history and examination were consistent with cubital tunnel syndrome resulting from a subluxing ulnar nerve. The patient presents at this time for subcutaneous anterior transposition of the ulnar nerve at the left elbow.  Procedure:   The patient underwent placement of an interscalene block in the preoperative holding area by the anesthesiologist before he was brought into the operating room and lain in the supine position. After adequate IV sedation was obtained, the patient's left upper extremity was prepped with ChloraPrep solution before being draped sterilely. Preoperative antibiotics were administered. After performing a timeout to verify the appropriate surgical site, the limb was exsanguinated with an Esmarch and the tourniquet inflated to 250 mmHg. An approximately 7-8 cm curvilinear incision was made along the course of the ulnar nerve posterior to the medial epicondyle. The incision was carried down through the subcutaneous tissues with care taken to avoid the small branches of the medial antebrachial nerve to expose the sheath overlying the cubital tunnel. The ulnar nerve was identified and noted to be overlying the medial epicondyle with the elbow flexed. The nerve was carefully unroofed,  then carefully followed as the ulnar nerve was released from proximal to distal. Distally, the fascia overlying the pronator muscle was released for several centimeters. A vessel loop was passed around the nerve and used to provide gentle traction on the nerve while circumferential dissection was carried out under loupe magnification using bipolar electrocautery and tenotomy scissors.   Once the nerve was fully mobilized, the anterior tissues were elevated as a flap just superficial to the fascia overlying the flexor wad and a pocket created to accept the nerve. Care was taken to be sure that there was no undue tension along the nerve either proximally or distally. The nerve was carefully retracted while several #0 Vicryl interrupted sutures were placed to reapproximate the flap to the medial epicondylar soft tissues, thereby creating a "sling" for the ulnar nerve. Again the nerve was checked approximately distally to be sure there was no undue tension on it.  The wound was copiously irrigated with sterile saline solution before the subcutaneous tissues were closed using 2-0 and 3-0 Vicryl interrupted sutures. The skin was closed using staples. A sterile bulky dressing was applied to the arm before the patient was placed into a sling. The patient was then awakened and returned to the recovery room in satisfactory condition after tolerating the procedure well.

## 2018-10-09 NOTE — Transfer of Care (Signed)
Immediate Anesthesia Transfer of Care Note  Patient: Tom Branch  Procedure(s) Performed: SUBCUTANEOUS TRANSPOSITION OF THE ULNAR NERVE OF LEFT ELBOW (Left Elbow)  Patient Location: PACU  Anesthesia Type: General, Regional  Level of Consciousness: awake, alert  and patient cooperative  Airway and Oxygen Therapy: Patient Spontanous Breathing and Patient connected to supplemental oxygen  Post-op Assessment: Post-op Vital signs reviewed, Patient's Cardiovascular Status Stable, Respiratory Function Stable, Patent Airway and No signs of Nausea or vomiting  Post-op Vital Signs: Reviewed and stable  Complications: No apparent anesthesia complications

## 2018-10-09 NOTE — Discharge Instructions (Signed)
General Anesthesia, Adult, Care After These instructions provide you with information about caring for yourself after your procedure. Your health care provider may also give you more specific instructions. Your treatment has been planned according to current medical practices, but problems sometimes occur. Call your health care provider if you have any problems or questions after your procedure. What can I expect after the procedure? After the procedure, it is common to have:  Vomiting.  A sore throat.  Mental slowness.  It is common to feel:  Nauseous.  Cold or shivery.  Sleepy.  Tired.  Sore or achy, even in parts of your body where you did not have surgery.  Follow these instructions at home: For at least 24 hours after the procedure:  Do not: ? Participate in activities where you could fall or become injured. ? Drive. ? Use heavy machinery. ? Drink alcohol. ? Take sleeping pills or medicines that cause drowsiness. ? Make important decisions or sign legal documents. ? Take care of children on your own.  Rest. Eating and drinking  If you vomit, drink water, juice, or soup when you can drink without vomiting.  Drink enough fluid to keep your urine clear or pale yellow.  Make sure you have little or no nausea before eating solid foods.  Follow the diet recommended by your health care provider. General instructions  Have a responsible adult stay with you until you are awake and alert.  Return to your normal activities as told by your health care provider. Ask your health care provider what activities are safe for you.  Take over-the-counter and prescription medicines only as told by your health care provider.  If you smoke, do not smoke without supervision.  Keep all follow-up visits as told by your health care provider. This is important. Contact a health care provider if:  You continue to have nausea or vomiting at home, and medicines are not helpful.  You  cannot drink fluids or start eating again.  You cannot urinate after 8-12 hours.  You develop a skin rash.  You have fever.  You have increasing redness at the site of your procedure. Get help right away if:  You have difficulty breathing.  You have chest pain.  You have unexpected bleeding.  You feel that you are having a life-threatening or urgent problem. This information is not intended to replace advice given to you by your health care provider. Make sure you discuss any questions you have with your health care provider. Document Released: 03/19/2001 Document Revised: 05/15/2016 Document Reviewed: 11/25/2015 Elsevier Interactive Patient Education  2018 [employer].   Orthopedic discharge instructions: Keep dressing dry and intact. Keep hand elevated above heart level. May shower after dressing removed on postop day 4 (Sunday). Cover sutures/staples with Band-Aids after drying off. Apply ice to affected area frequently. Take ibuprofen 600-800 mg TID with meals for 7-10 days, then as necessary. Take ES Tylenol or pain medication as prescribed when needed.  Return for follow-up in 10-14 days or as scheduled.

## 2018-10-10 ENCOUNTER — Encounter: Payer: Self-pay | Admitting: Surgery

## 2018-10-24 ENCOUNTER — Ambulatory Visit
Admission: RE | Admit: 2018-10-24 | Discharge: 2018-10-24 | Disposition: A | Discharge status: Home / Self Care | Payer: Self-pay | Source: Ambulatory Visit | Attending: Cardiothoracic Surgery | Admitting: Cardiothoracic Surgery

## 2018-10-24 ENCOUNTER — Other Ambulatory Visit: Payer: Self-pay | Admitting: Cardiothoracic Surgery

## 2018-10-24 DIAGNOSIS — M954 Acquired deformity of chest and rib: Secondary | ICD-10-CM

## 2018-11-08 ENCOUNTER — Ambulatory Visit: Payer: Self-pay | Admitting: Cardiothoracic Surgery

## 2018-11-22 DIAGNOSIS — J028 Acute pharyngitis due to other specified organisms: Secondary | ICD-10-CM | POA: Diagnosis not present

## 2018-11-22 DIAGNOSIS — M25572 Pain in left ankle and joints of left foot: Secondary | ICD-10-CM | POA: Diagnosis not present

## 2018-12-09 ENCOUNTER — Other Ambulatory Visit: Payer: Self-pay

## 2018-12-09 ENCOUNTER — Emergency Department (EMERGENCY_DEPARTMENT_HOSPITAL)
Admission: EM | Admit: 2018-12-09 | Discharge: 2018-12-10 | Disposition: A | Discharge status: Other Acute Inpatient Hospital | Payer: 59 | Source: Home / Self Care | Attending: Emergency Medicine | Admitting: Emergency Medicine

## 2018-12-09 DIAGNOSIS — Z046 Encounter for general psychiatric examination, requested by authority: Secondary | ICD-10-CM | POA: Insufficient documentation

## 2018-12-09 DIAGNOSIS — R45851 Suicidal ideations: Secondary | ICD-10-CM | POA: Insufficient documentation

## 2018-12-09 DIAGNOSIS — J45909 Unspecified asthma, uncomplicated: Secondary | ICD-10-CM

## 2018-12-09 DIAGNOSIS — Z79899 Other long term (current) drug therapy: Secondary | ICD-10-CM | POA: Insufficient documentation

## 2018-12-09 DIAGNOSIS — F32A Depression, unspecified: Secondary | ICD-10-CM

## 2018-12-09 DIAGNOSIS — F332 Major depressive disorder, recurrent severe without psychotic features: Secondary | ICD-10-CM | POA: Diagnosis not present

## 2018-12-09 DIAGNOSIS — S61519A Laceration without foreign body of unspecified wrist, initial encounter: Secondary | ICD-10-CM

## 2018-12-09 DIAGNOSIS — F329 Major depressive disorder, single episode, unspecified: Secondary | ICD-10-CM | POA: Insufficient documentation

## 2018-12-09 DIAGNOSIS — R4589 Other symptoms and signs involving emotional state: Secondary | ICD-10-CM

## 2018-12-09 DIAGNOSIS — F314 Bipolar disorder, current episode depressed, severe, without psychotic features: Secondary | ICD-10-CM | POA: Diagnosis not present

## 2018-12-09 DIAGNOSIS — X789XXA Intentional self-harm by unspecified sharp object, initial encounter: Secondary | ICD-10-CM

## 2018-12-09 DIAGNOSIS — Z91040 Latex allergy status: Secondary | ICD-10-CM

## 2018-12-09 LAB — COMPREHENSIVE METABOLIC PANEL
ALBUMIN: 4.5 g/dL (ref 3.5–5.0)
ALK PHOS: 83 U/L (ref 38–126)
ALT: 30 U/L (ref 0–44)
AST: 26 U/L (ref 15–41)
Anion gap: 8 (ref 5–15)
BILIRUBIN TOTAL: 0.5 mg/dL (ref 0.3–1.2)
BUN: 12 mg/dL (ref 6–20)
CO2: 23 mmol/L (ref 22–32)
CREATININE: 1.05 mg/dL (ref 0.61–1.24)
Calcium: 8.9 mg/dL (ref 8.9–10.3)
Chloride: 107 mmol/L (ref 98–111)
GFR calc Af Amer: >60 mL/min (ref >60)
GFR calc non Af Amer: >60 mL/min (ref >60)
GLUCOSE: 90 mg/dL (ref 70–99)
POTASSIUM: 3.7 mmol/L (ref 3.5–5.1)
Sodium: 138 mmol/L (ref 135–145)
Total Protein: 7.6 g/dL (ref 6.5–8.1)

## 2018-12-09 LAB — URINE DRUG SCREEN, QUALITATIVE (ARMC ONLY)
AMPHETAMINES, UR SCREEN: NOT DETECTED
Barbiturates, Ur Screen: NOT DETECTED
Benzodiazepine, Ur Scrn: NOT DETECTED
COCAINE METABOLITE, UR ~~LOC~~: NOT DETECTED
Cannabinoid 50 Ng, Ur ~~LOC~~: NOT DETECTED
MDMA (Ecstasy)Ur Screen: NOT DETECTED
METHADONE SCREEN, URINE: NOT DETECTED
OPIATE, UR SCREEN: NOT DETECTED
Phencyclidine (PCP) Ur S: NOT DETECTED
Tricyclic, Ur Screen: NOT DETECTED

## 2018-12-09 LAB — CBC
HEMATOCRIT: 48 % (ref 39.0–52.0)
HEMOGLOBIN: 16.3 g/dL (ref 13.0–17.0)
MCH: 30.2 pg (ref 26.0–34.0)
MCHC: 34 g/dL (ref 30.0–36.0)
MCV: 88.9 fL (ref 80.0–100.0)
NRBC: 0 % (ref 0.0–0.2)
Platelets: 343 10*3/uL (ref 150–400)
RBC: 5.4 MIL/uL (ref 4.22–5.81)
RDW: 13.2 % (ref 11.5–15.5)
WBC: 11.5 10*3/uL — AB (ref 4.0–10.5)

## 2018-12-09 LAB — ACETAMINOPHEN LEVEL: Acetaminophen (Tylenol), Serum: <10 ug/mL — ABNORMAL LOW (ref 10–30)

## 2018-12-09 LAB — ETHANOL: Alcohol, Ethyl (B): <10 mg/dL (ref <10)

## 2018-12-09 LAB — SALICYLATE LEVEL: Salicylate Lvl: <7 mg/dL (ref 2.8–30.0)
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
## 2018-12-09 NOTE — ED Provider Notes (Signed)
[facility] Emergency Department Provider Note  ____________________________________________   I have reviewed the triage vital signs and the nursing notes. Where available I have reviewed prior notes and, if possible and indicated, outside hospital notes.    HISTORY  Chief Complaint Psychiatric Evaluation    HPI Tom Branch is a 22 y.o. male  Who presents today complaining of self-harm thoughts.  Does have a history of suicidality.  Many years ago he overdosed on Tylenol.  He has somewhat vague thoughts of self-harm but nothing in particular, he denies being abused.  He states that he was going to kill himself but then his mother interrupted him and instead he just scratched his antecubital fossa bilaterally did not scratch himself anywhere else did not take any other overdose.  Is been feeling depressed for several weeks, nothing makes it better nothing makes worse no other alleviating or aggravating factors no other complaints    Past Medical History:  Diagnosis Date  . ADHD   . Adopted    no family medical hx  . Asthma   . Kidney stones   . Wears contact lenses     There are no active problems to display for this patient.   Past Surgical History:  Procedure Laterality Date  . LITHOTRIPSY    . TONSILLECTOMY N/A 12/13/2016   Procedure: TONSILLECTOMY;  Surgeon: Bud Face, MD;  Location: [facility];  Service: ENT;  Laterality: N/A;  . ULNAR NERVE TRANSPOSITION Left 10/09/2018   Procedure: SUBCUTANEOUS TRANSPOSITION OF THE ULNAR NERVE OF LEFT ELBOW;  Surgeon: Christena Flake, MD;  Location: [facility];  Service: Orthopedics;  Laterality: Left;  . WISDOM TOOTH EXTRACTION      Prior to Admission medications   Medication Sig Start Date End Date Taking? Authorizing Provider  albuterol (PROVENTIL HFA;VENTOLIN HFA) 108 (90 Base) MCG/ACT inhaler Inhale into the lungs every 6 (six) hours as needed for wheezing or shortness of  breath.    [provider]  ARIPiprazole (ABILIFY) 10 MG tablet Take 10 mg by mouth daily.    [provider]  EPINEPHrine (EPIPEN 2-PAK) 0.3 mg/0.3 mL IJ SOAJ injection Inject 0.3 mLs (0.3 mg total) into the muscle once. 02/09/16   Rebecka Apley, MD  esomeprazole (NEXIUM) 20 MG capsule Take 20 mg by mouth daily at 12 noon.    [provider]  Melatonin 3 MG TABS Take 12 mg by mouth at bedtime as needed.    [provider]  methylphenidate 36 MG PO CR tablet Take 72 mg by mouth daily.     [provider]  Omega-3 Fatty Acids (FISH OIL PO) Take by mouth daily.    [provider]  oxyCODONE (ROXICODONE) 5 MG immediate release tablet Take 1-2 tablets (5-10 mg total) by mouth every 4 (four) hours as needed for severe pain. 10/09/18   Poggi, Excell Seltzer, MD    Allergies Dilaudid [hydromorphone hcl]; Latex; Meat extract; and Other  No family history on file.  Social History Social History   Tobacco Use  . Smoking status: Never Smoker  . Smokeless tobacco: Never Used  Substance Use Topics  . Alcohol use: No  . Drug use: No    Review of Systems Constitutional: No fever/chills Eyes: No visual changes. ENT: No sore throat. No stiff neck no neck pain Cardiovascular: Denies chest pain. Respiratory: Denies shortness of breath. Gastrointestinal:   no vomiting.  No diarrhea.  No constipation. Genitourinary: Negative for dysuria. Musculoskeletal: Negative  lower extremity swelling Skin: Negative for rash. Neurological: Negative for severe headaches, focal weakness or numbness.   ____________________________________________   PHYSICAL EXAM:  VITAL SIGNS: ED Triage Vitals  Enc Vitals Group     BP 12/09/18 2029 127/80     Pulse Rate 12/09/18 2029 88     Resp 12/09/18 2029 16     Temp 12/09/18 2029 98.9 F (37.2 C)     Temp Source 12/09/18 2029 Oral     SpO2 --      Weight 12/09/18 2028 184 lb (83.5 kg)     Height 12/09/18 2028 5'  6" (1.676 m)     Head Circumference --      Peak Flow --      Pain Score 12/09/18 2028 3     Pain Loc --      Pain Edu? --      Excl. in GC? --     Constitutional: Alert and oriented. Well appearing and in no acute distress. Eyes: Conjunctivae are normal Head: Atraumatic HEENT: No congestion/rhinnorhea. Mucous membranes are moist.  Oropharynx non-erythematous Neck:   Nontender with no meningismus, no masses, no stridor Cardiovascular: Normal rate, regular rhythm. Grossly normal heart sounds.  Good peripheral circulation. Respiratory: Normal respiratory effort.  No retractions. Lungs CTAB. Abdominal: Soft and nontender. No distention. No guarding no rebound Back:  There is no focal tenderness or step off.  there is no midline tenderness there are no lesions noted. there is no CVA tenderness Musculoskeletal: No lower extremity tenderness, no upper extremity tenderness. No joint effusions, no DVT signs strong distal pulses no edema Neurologic:  Normal speech and language. No gross focal neurologic deficits are appreciated.  Skin:  Skin is warm, dry and intact. No rash noted.  There are superficial scratches noted to the anterior region of the antecubital fossa bilaterally, none of them require sutures.  None are deep and none are bleeding Psychiatric: Mood and affect are depressed. Speech and behavior are normal.  ____________________________________________   LABS (all labs ordered are listed, but only abnormal results are displayed)  Labs Reviewed  CBC - Abnormal; Notable for the following components:      Result Value   WBC 11.5 (*)    All other components within normal limits  COMPREHENSIVE METABOLIC PANEL  ETHANOL  URINE DRUG SCREEN, QUALITATIVE ([facility] ONLY)  SALICYLATE LEVEL  ACETAMINOPHEN LEVEL    Pertinent labs  results that were available during my care of the patient were reviewed by me and considered in my medical decision making (see chart for  details). ____________________________________________  EKG  I personally interpreted any EKGs ordered by me or triage  ____________________________________________  RADIOLOGY  Pertinent labs & imaging results that were available during my care of the patient were reviewed by me and considered in my medical decision making (see chart for details). If possible, patient and/or family made aware of any abnormal findings.  No results found. ____________________________________________    PROCEDURES  Procedure(s) performed: None  Procedures  Critical Care performed: None  ____________________________________________   INITIAL IMPRESSION / ASSESSMENT AND PLAN / ED COURSE  Pertinent labs & imaging results that were available during my care of the patient were reviewed by me and considered in my medical decision making (see chart for details).  Patient here with SI scratches arms with history though of significant overdoses in the past.  Denies overdose this time.  Negative toxidrome noted.  Salicylate and Tylenol are pending.  If those are negative he  will be medically clear from my point of view    ____________________________________________   FINAL CLINICAL IMPRESSION(S) / ED DIAGNOSES  Final diagnoses:  None      This chart was dictated using voice recognition software.  Despite best efforts to proofread,  errors can occur which can change meaning.      Jeanmarie PlantMcShane, Oziel Beitler A, MD 12/09/18 2136

## 2018-12-09 NOTE — ED Notes (Signed)
Pt belongings: 1 black t-shirt, 1 gray pr sweat pants, 1 pt red boxer briefs, 1 pr gray socks, and 1 pr gray tennis shoes. No wallet, cell phone, keys, or other valuables brought to ED with pt.

## 2018-12-09 NOTE — ED Notes (Signed)
Pt given sandwich tray and drink.  Pt came back to 20h with gauze wrap around both elbows.  Wraps removed ans 2x2 gauze with paper tape was placed on superficial woulds after MD saw them.

## 2018-12-09 NOTE — ED Triage Notes (Addendum)
Pt arrives to ED via [facility] from [facility] witrh IVC paperwork in place. Documentation states pt "has suicidal ideation...cutting his arms this morning in attempt to die. His mom came home and stopped him". Pt arrives with bilateral ACs bandaged from SI attempt by cutting earlier today. Pt denies ETOH or drug use PTA. Pt denies A/VH, no previous h/x of psychiatric issues or treatment.

## 2018-12-10 ENCOUNTER — Inpatient Hospital Stay
Admission: AD | Admit: 2018-12-10 | Discharge: 2018-12-16 | DRG: 885 | Disposition: A | Discharge status: Home / Self Care | Payer: 59 | Source: Intra-hospital | Attending: Psychiatry | Admitting: Psychiatry

## 2018-12-10 ENCOUNTER — Encounter: Payer: Self-pay | Admitting: *Deleted

## 2018-12-10 ENCOUNTER — Other Ambulatory Visit: Payer: Self-pay

## 2018-12-10 DIAGNOSIS — G4700 Insomnia, unspecified: Secondary | ICD-10-CM | POA: Diagnosis present

## 2018-12-10 DIAGNOSIS — F4310 Post-traumatic stress disorder, unspecified: Secondary | ICD-10-CM | POA: Diagnosis present

## 2018-12-10 DIAGNOSIS — Z91040 Latex allergy status: Secondary | ICD-10-CM | POA: Diagnosis not present

## 2018-12-10 DIAGNOSIS — R45851 Suicidal ideations: Secondary | ICD-10-CM | POA: Diagnosis present

## 2018-12-10 DIAGNOSIS — F902 Attention-deficit hyperactivity disorder, combined type: Secondary | ICD-10-CM | POA: Diagnosis not present

## 2018-12-10 DIAGNOSIS — Z79899 Other long term (current) drug therapy: Secondary | ICD-10-CM

## 2018-12-10 DIAGNOSIS — F332 Major depressive disorder, recurrent severe without psychotic features: Secondary | ICD-10-CM

## 2018-12-10 DIAGNOSIS — F909 Attention-deficit hyperactivity disorder, unspecified type: Secondary | ICD-10-CM | POA: Diagnosis present

## 2018-12-10 DIAGNOSIS — S61519A Laceration without foreign body of unspecified wrist, initial encounter: Secondary | ICD-10-CM | POA: Diagnosis present

## 2018-12-10 DIAGNOSIS — Z885 Allergy status to narcotic agent status: Secondary | ICD-10-CM

## 2018-12-10 DIAGNOSIS — F429 Obsessive-compulsive disorder, unspecified: Secondary | ICD-10-CM | POA: Diagnosis present

## 2018-12-10 DIAGNOSIS — Z915 Personal history of self-harm: Secondary | ICD-10-CM

## 2018-12-10 DIAGNOSIS — F314 Bipolar disorder, current episode depressed, severe, without psychotic features: Secondary | ICD-10-CM | POA: Diagnosis not present

## 2018-12-10 DIAGNOSIS — X789XXA Intentional self-harm by unspecified sharp object, initial encounter: Secondary | ICD-10-CM | POA: Diagnosis present

## 2018-12-10 DIAGNOSIS — Z888 Allergy status to other drugs, medicaments and biological substances status: Secondary | ICD-10-CM | POA: Diagnosis not present

## 2018-12-10 DIAGNOSIS — Z5181 Encounter for therapeutic drug level monitoring: Secondary | ICD-10-CM | POA: Diagnosis not present

## 2018-12-10 HISTORY — DX: Intentional self-harm by unspecified sharp object, initial encounter: X78.9XXA

## 2018-12-10 MED ORDER — ARIPIPRAZOLE 10 MG PO TABS
10.0000 mg | ORAL_TABLET | Freq: Every day | ORAL | Status: DC
  Administered 2018-12-10: 10 mg via ORAL
  Filled 2018-12-10: qty 1

## 2018-12-10 MED ORDER — ALUM & MAG HYDROXIDE-SIMETH 200-200-20 MG/5ML PO SUSP: 30.0000 mL | ORAL | Status: DC | PRN

## 2018-12-10 MED ORDER — HYDROXYZINE HCL 50 MG PO TABS
50.0000 mg | ORAL_TABLET | Freq: Three times a day (TID) | ORAL | Status: DC | PRN
  Administered 2018-12-12 – 2018-12-15 (×4): 50 mg via ORAL
  Filled 2018-12-10 (×4): qty 1

## 2018-12-10 MED ORDER — TRAZODONE HCL 100 MG PO TABS: 100.0000 mg | ORAL_TABLET | Freq: Every evening | ORAL | Status: DC | PRN

## 2018-12-10 MED ORDER — ACETAMINOPHEN 325 MG PO TABS: 650.0000 mg | ORAL_TABLET | Freq: Four times a day (QID) | ORAL | Status: DC | PRN

## 2018-12-10 MED ORDER — MAGNESIUM HYDROXIDE 400 MG/5ML PO SUSP: 30.0000 mL | Freq: Every day | ORAL | Status: DC | PRN

## 2018-12-10 MED ORDER — ARIPIPRAZOLE 10 MG PO TABS
10.0000 mg | ORAL_TABLET | Freq: Every day | ORAL | Status: DC
  Administered 2018-12-11 – 2018-12-16 (×6): 10 mg via ORAL
  Filled 2018-12-10 (×6): qty 1

## 2018-12-10 NOTE — Progress Notes (Signed)
D - Patient was in the day room upon arrival to the unit. Patient was pleasant during assessment. Patient denies SI/HI/AVH, pain, anxiety and depression. Patient presented with rapid/pressured speech. Patient was seen acting appropriately on the unit with staff and peers. Patient stated, "I had a really good day, considering where I am. I know this is where I should be right now. I am ready to get better."   A - Patient didn't have any medications scheduled this evening. Patient given education. Patient given support and encouragement. Patient informed to let staff know if there are any issues or problems on the unit.   R - Patient being monitored Q 15 minutes for safety per unit protocol. Patient remains safe on the unit at this time.

## 2018-12-10 NOTE — BH Assessment (Signed)
[facility] by Dr. Toni Amendlapacs.  Attending Physician will be Dr. Jennet MaduroPucilowska.   Patient [facility].   Intake Paper Work has been signed and placed on patient chart.  ER staff is aware of the admission:  Irving BurtonEmily, ER Secretary    Dr. Cyril LoosenKinner, ER MD   Amy B., Patient's Nurse   Mosie EpsteinGwen Stephens, Patient Access.

## 2018-12-10 NOTE — ED Notes (Signed)
Patient resting quietly in room. No noted distress or abnormal behaviors noted. Will continue 15 minute checks and observation by security camera for safety. 

## 2018-12-10 NOTE — Consult Note (Signed)
[facility] Face-to-Face Psychiatry Consult   Reason for Consult: Consult for 22 year old man who came into the hospital after being discovered by his mother cutting himself Referring Physician: Cyril Loosen Patient Identification: Tom Branch MRN:  [id number] Principal Diagnosis: Severe recurrent major depression without psychotic features (HCC) Diagnosis:  Principal Problem:   Severe recurrent major depression without psychotic features (HCC) Active Problems:   Self-inflicted laceration of wrist   Total Time spent with patient: 1 hour  Subjective:   Tom Branch is a 22 y.o. male patient admitted with "I regret it, but I had a bad day".  HPI: Patient seen chart reviewed.  22 year old man was discovered by his mother yesterday cutting his arm with a steak knife.  She had gone out briefly and had tried to hide the knives from him but he still found 1.  Patient was taken to [facility] and evaluated and then referred on to our hospital.  Patient is now downplaying the significance of this saying he did not actually want to die although it sounds like he was more suicidal yesterday.  He says yesterday was a very bad day and he had "sudden depression".  The only stress he can put his finger on is that his grandmother died earlier in the year and also that work is stressful.  His sleep patterns are chaotic.  Appetite normal.  No other physical symptoms.  Denies alcohol or drug abuse.  Patient says he goes to [facility] and is compliant with 10 mg of Abilify per day.  Denies homicidal ideation denies psychotic symptoms  Social history: Patient is living at home with his parents.  Works at [employer].  He served in [employer] but evidently had a discharge that does not qualify him for veterans benefits.  Medical history: No active ongoing medical problems outside of the psychiatric.  Multiple superficial cuts in the antecubital fossa of the right arm none of them look like they would require stitching or any specific  treatment.  Past history of Tylenol overdose.  Substance abuse history: Denies alcohol or drug abuse or any past substance abuse issues  Past Psychiatric History: Patient does have previous suicide attempts most recently in September of this year when he overdosed on Tylenol.  He is currently prescribed Abilify 10 mg a day and sees a doctor and therapist at [facility].  He says he has been diagnosed with PTSD and depression.  Risk to Self: Suicidal Ideation: Yes-Currently Present(Denies current) Suicidal Intent: No-Not Currently/Within Last 6 Months Is patient at risk for suicide?: Yes Suicidal Plan?: Yes-Currently Present Specify Current Suicidal Plan: Cut his arms Access to Means: Yes Specify Access to Suicidal Means: Access to knives What has been your use of drugs/alcohol within the last 12 months?: Denied use of alcohl or drugs How many times?: 5 Other Self Harm Risks: denied Triggers for Past Attempts: None known Intentional Self Injurious Behavior: None Risk to Others: Homicidal Ideation: No Thoughts of Harm to Others: No Current Homicidal Intent: No Current Homicidal Plan: No Access to Homicidal Means: No Identified Victim: None identified History of harm to others?: No Assessment of Violence: None Noted Does patient have access to weapons?: No Criminal Charges Pending?: No Does patient have a court date: No Prior Inpatient Therapy: Prior Inpatient Therapy: No(Unsure- adopted) Prior Outpatient Therapy: Prior Outpatient Therapy: Yes Prior Therapy Dates: Current Prior Therapy Facilty/Provider(s): [facility] Reason for Treatment: Depression, anxiety Does patient have an ACCT team?: No Does patient have Intensive In-House Services?  : No Does patient  have [facility] services? : No Does patient have P4CC services?: No  Past Medical History:  Past Medical History:  Diagnosis Date  . ADHD   . Adopted    no family medical hx  . Asthma   . Kidney stones   . Wears contact lenses       Past Surgical History:  Procedure Laterality Date  . LITHOTRIPSY    . TONSILLECTOMY N/A 12/13/2016   Procedure: TONSILLECTOMY;  Surgeon: Bud Facereighton Vaught, MD;  [facility];  Service: ENT;  Laterality: N/A;  . ULNAR NERVE TRANSPOSITION Left 10/09/2018   Procedure: SUBCUTANEOUS TRANSPOSITION OF THE ULNAR NERVE OF LEFT ELBOW;  Surgeon: Christena FlakePoggi, John J, MD;  [facility];  Service: Orthopedics;  Laterality: Left;  . WISDOM TOOTH EXTRACTION     Family History: No family history on file. Family Psychiatric  History: Does not know as he is adopted Social History:  Social History   Substance and Sexual Activity  Alcohol Use No     Social History   Substance and Sexual Activity  Drug Use No    Social History   Socioeconomic History  . Marital status: Single    Spouse name: Not on file  . Number of children: Not on file  . Years of education: Not on file  . Highest education level: Not on file  Occupational History  . Not on file  Social Needs  . Financial resource strain: Not on file  . Food insecurity:    Worry: Not on file    Inability: Not on file  . Transportation needs:    Medical: Not on file    Non-medical: Not on file  Tobacco Use  . Smoking status: Never Smoker  . Smokeless tobacco: Never Used  Substance and Sexual Activity  . Alcohol use: No  . Drug use: No  . Sexual activity: Not on file  Lifestyle  . Physical activity:    Days per week: Not on file    Minutes per session: Not on file  . Stress: Not on file  Relationships  . Social connections:    Talks on phone: Not on file    Gets together: Not on file    Attends religious service: Not on file    Active member of club or organization: Not on file    Attends meetings of clubs or organizations: Not on file    Relationship status: Not on file  Other Topics Concern  . Not on file  Social History Narrative  . Not on file   Additional Social History:    Allergies:    Allergies  Allergen Reactions  . Dilaudid [Hydromorphone Hcl] Hives  . Latex Hives    Gloves (when worn)  . Lactose Intolerance (Gi)   . Meat Extract Hives    Red meat  . Other     Bleach, Neosporin: Rash    Labs:  Results for orders placed or performed during the hospital encounter of 12/09/18 (from the past 48 hour(s))  Comprehensive metabolic panel     Status: None   Collection Time: 12/09/18  8:35 PM  Result Value Ref Range   Sodium 138 135 - 145 mmol/L   Potassium 3.7 3.5 - 5.1 mmol/L   Chloride 107 98 - 111 mmol/L   CO2 23 22 - 32 mmol/L   Glucose, Bld 90 70 - 99 mg/dL   BUN 12 6 - 20 mg/dL   Creatinine, Ser 8.291.05 0.61 - 1.24 mg/dL  Calcium 8.9 8.9 - 10.3 mg/dL   Total Protein 7.6 6.5 - 8.1 g/dL   Albumin 4.5 3.5 - 5.0 g/dL   AST 26 15 - 41 U/L   ALT 30 0 - 44 U/L   Alkaline Phosphatase 83 38 - 126 U/L   Total Bilirubin 0.5 0.3 - 1.2 mg/dL   GFR calc non Af Amer >60 >60 mL/min   GFR calc Af Amer >60 >60 mL/min   Anion gap 8 5 - 15    Comment: Performed at [facility], [address] [place], [place] [address]  Ethanol     Status: None   Collection Time: 12/09/18  8:35 PM  Result Value Ref Range   Alcohol, Ethyl (B) <10 <10 mg/dL    Comment: (NOTE) Lowest detectable limit for serum alcohol is 10 mg/dL. For medical purposes only. Performed at [facility], [address] [place], [place] [address]   Salicylate level     Status: None   Collection Time: 12/09/18  8:35 PM  Result Value Ref Range   Salicylate Lvl <7.0 2.8 - 30.0 mg/dL    Comment: Performed at [facility], [address] [place], [place] [address]  Acetaminophen level     Status: Abnormal   Collection Time: 12/09/18  8:35 PM  Result Value Ref Range   Acetaminophen (Tylenol), Serum <10 (L) 10 - 30 ug/mL    Comment: (NOTE) Therapeutic concentrations vary significantly. A range of 10-30 ug/mL  may be an effective concentration for many patients. However, some   are best treated at concentrations outside of this range. Acetaminophen concentrations >150 ug/mL at 4 hours after ingestion  and >50 ug/mL at 12 hours after ingestion are often associated with  toxic reactions. Performed at [facility], [address] [place], [place] [address]   cbc     Status: Abnormal   Collection Time: 12/09/18  8:35 PM  Result Value Ref Range   WBC 11.5 (H) 4.0 - 10.5 K/uL   RBC 5.40 4.22 - 5.81 MIL/uL   Hemoglobin 16.3 13.0 - 17.0 g/dL   HCT 65.7 84.6 - 96.2 %   MCV 88.9 80.0 - 100.0 fL   MCH 30.2 26.0 - 34.0 pg   MCHC 34.0 30.0 - 36.0 g/dL   RDW 95.2 84.1 - 32.4 %   Platelets 343 150 - 400 K/uL   nRBC 0.0 0.0 - 0.2 %    Comment: Performed at [facility], [address], [place], [place] [address]  Urine Drug Screen, Qualitative     Status: None   Collection Time: 12/09/18  8:35 PM  Result Value Ref Range   Tricyclic, Ur Screen NONE DETECTED NONE DETECTED   Amphetamines, Ur Screen NONE DETECTED NONE DETECTED   MDMA (Ecstasy)Ur Screen NONE DETECTED NONE DETECTED   Cocaine Metabolite,Ur [place] NONE DETECTED NONE DETECTED   Opiate, Ur Screen NONE DETECTED NONE DETECTED   Phencyclidine (PCP) Ur S NONE DETECTED NONE DETECTED   Cannabinoid 50 Ng, Ur  NONE DETECTED NONE DETECTED   Barbiturates, Ur Screen NONE DETECTED NONE DETECTED   Benzodiazepine, Ur Scrn NONE DETECTED NONE DETECTED   Methadone Scn, Ur NONE DETECTED NONE DETECTED    Comment: (NOTE) Tricyclics + metabolites, urine    Cutoff 1000 ng/mL Amphetamines + metabolites, urine  Cutoff 1000 ng/mL MDMA (Ecstasy), urine              Cutoff 500 ng/mL Cocaine Metabolite, urine  Cutoff 300 ng/mL Opiate + metabolites, urine        Cutoff 300 ng/mL Phencyclidine (PCP), urine         Cutoff 25 ng/mL Cannabinoid, urine                 Cutoff 50 ng/mL Barbiturates + metabolites, urine  Cutoff 200 ng/mL Benzodiazepine, urine              Cutoff 200 ng/mL Methadone, urine                    Cutoff 300 ng/mL The urine drug screen provides only a preliminary, unconfirmed analytical test result and should not be used for non-medical purposes. Clinical consideration and professional judgment should be applied to any positive drug screen result due to possible interfering substances. A more specific alternate chemical method must be used in order to obtain a confirmed analytical result. Gas chromatography / mass spectrometry (GC/MS) is the preferred confirmat ory method. Performed at [facility], [address] [place], [place] [address]     No current facility-administered medications for this encounter.    Current Outpatient Medications  Medication Sig Dispense Refill  . albuterol (PROVENTIL HFA;VENTOLIN HFA) 108 (90 Base) MCG/ACT inhaler Inhale into the lungs every 6 (six) hours as needed for wheezing or shortness of breath.    . ARIPiprazole (ABILIFY) 10 MG tablet Take 10 mg by mouth daily.    Marland Kitchen EPINEPHrine (EPIPEN 2-PAK) 0.3 mg/0.3 mL IJ SOAJ injection Inject 0.3 mLs (0.3 mg total) into the muscle once. 1 Device 0  . esomeprazole (NEXIUM) 20 MG capsule Take 20 mg by mouth daily at 12 noon.    Marland Kitchen ibuprofen (ADVIL,MOTRIN) 200 MG tablet Take 4 tablets by mouth as needed.    . loratadine (CLARITIN) 10 MG tablet Take 1 tablet by mouth as needed.    . Melatonin 3 MG TABS Take 12 mg by mouth at bedtime as needed.    . methylphenidate 36 MG PO CR tablet Take 72 mg by mouth daily.     . Multiple Vitamin (MULTI-VITAMINS) TABS Take 1 tablet by mouth daily.    . naproxen sodium (ALEVE) 220 MG tablet Take 1 tablet by mouth 3 (three) times daily as needed.    . Omega-3 Fatty Acids (FISH OIL PO) Take by mouth daily.    Marland Kitchen oxyCODONE (ROXICODONE) 5 MG immediate release tablet Take 1-2 tablets (5-10 mg total) by mouth every 4 (four) hours as needed for severe pain. (Patient not taking: Reported on 12/10/2018) 40 tablet 0    Musculoskeletal: Strength & Muscle Tone:  within normal limits Gait & Station: normal Patient leans: N/A  Psychiatric Specialty Exam: Physical Exam  Nursing note and vitals reviewed. Constitutional: He appears well-developed and well-nourished.  HENT:  Head: Normocephalic and atraumatic.  Eyes: Pupils are equal, round, and reactive to light. Conjunctivae are normal.  Neck: Normal range of motion.  Cardiovascular: Regular rhythm and normal heart sounds.  Respiratory: Effort normal. No respiratory distress.  GI: Soft.  Musculoskeletal: Normal range of motion.  Neurological: He is alert.  Skin: Skin is warm and dry.     Psychiatric: His affect is blunt. His speech is delayed. Thought content is not paranoid. Cognition and memory are normal. He expresses impulsivity. He expresses no suicidal ideation.    Review of Systems  Constitutional: Negative.   HENT: Negative.   Eyes: Negative.   Respiratory: Negative.   Cardiovascular: Negative.   Gastrointestinal: Negative.  Musculoskeletal: Negative.   Skin: Negative.   Neurological: Negative.   Psychiatric/Behavioral: Positive for depression. Negative for hallucinations, memory loss, substance abuse and suicidal ideas. The patient is nervous/anxious and has insomnia.     Blood pressure 127/80, pulse 88, temperature 98.9 F (37.2 C), temperature source Oral, resp. rate 16, height 5\' 6"  (1.676 m), weight 83.5 kg.Body mass index is 29.7 kg/m.  General Appearance: Casual  Eye Contact:  Good  Speech:  Clear and Coherent  Volume:  Decreased  Mood:  Dysphoric  Affect:  Constricted  Thought Process:  Goal Directed  Orientation:  Full (Time, Place, and Person)  Thought Content:  Logical  Suicidal Thoughts:  No  Homicidal Thoughts:  No  Memory:  Immediate;   Fair Recent;   Fair Remote;   Fair  Judgement:  Impaired  Insight:  Shallow  Psychomotor Activity:  Decreased  Concentration:  Concentration: Poor  Recall:  [employer] of Knowledge:  Fair  Language:  Fair    Akathisia:  No  Handed:  Right  AIMS (if indicated):     Assets:  Desire for Improvement Intimacy Physical Health Resilience Social Support  ADL's:  Intact  Cognition:  WNL  Sleep:        Treatment Plan Summary: Daily contact with patient to assess and evaluate symptoms and progress in treatment, Medication management and Plan Patient will be admitted to the psychiatric ward.  Continue IVC.  15-minute checks.  Full set of labs.  Continue current medicine.  Full individual and group therapy [place] provided and assessment by treatment team  Disposition: Recommend psychiatric Inpatient admission when medically cleared. Supportive therapy provided about ongoing stressors.  Mordecai Rasmussen, MD 12/10/2018 12:40 PM

## 2018-12-10 NOTE — Plan of Care (Signed)
Patient is new to the unit but did state that he had a good day and that he is glad that he is here. Patient denies SI/HI/AVH, pain, anxiety and depression.   Problem: Education: Goal: Emotional status will improve Outcome: Progressing Goal: Mental status will improve Outcome: Progressing

## 2018-12-10 NOTE — ED Notes (Signed)
Hourly rounding reveals patient sleeping in room. No complaints, stable, in no acute distress. Q15 minute rounds and monitoring via Security Cameras to continue. 

## 2018-12-10 NOTE — ED Notes (Signed)
IVC/Consult Pending  

## 2018-12-10 NOTE — Tx Team (Signed)
Initial Treatment Plan 12/10/2018 4:12 PM Tom MarchKaleb A Shiffler WGN:[id number]    PATIENT STRESSORS: Health problems Traumatic event   PATIENT STRENGTHS: Capable of independent living Licensed conveyancerCommunication skills Financial means Motivation for treatment/growth Religious Affiliation Supportive family/friends Work skills   PATIENT IDENTIFIED PROBLEMS: Alteration in mood (Depression) "I just had a rough day yesterday and cut myself".  Risk for self harm (lacerations to bilateral arms, recent overdose in September)  Ineffective coping skills                 DISCHARGE CRITERIA:  Improved stabilization in mood, thinking, and/or behavior Verbal commitment to aftercare and medication compliance  PRELIMINARY DISCHARGE PLAN: Outpatient therapy Return to previous work or school arrangements  PATIENT/FAMILY INVOLVEMENT: This treatment plan has been presented to and reviewed with the patient, Tom MarchKaleb A Baskett. The patient have been given the opportunity to ask questions and make suggestions.  Sherryl MangesWesseh, Walsie Smeltz, RN 12/10/2018, 4:12 PM
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
## 2018-12-10 NOTE — ED Notes (Signed)
Pt. Transferred to BHU from ED to room 5 after screening for contraband. Report to include Situation, Background, Assessment and Recommendations from [facility]. Pt. Oriented to unit including Q15 minute rounds as well as the security cameras for their protection. Patient is alert and oriented, warm and dry in no acute distress. Patient denies SI, HI, and AVH. Pt. Encouraged to let me know if needs arise.

## 2018-12-10 NOTE — ED Notes (Signed)
Pt's girlfriend visiting. Visit monitored by Engineer, materialssecurity officer.   Maintained on 15 minute checks and observation by security camera for safety.

## 2018-12-10 NOTE — ED Notes (Signed)
Pt discharged under IVC to BMU. VS stable. Report given to Arnegardaroline, Charity fundraiserN. All belongings sent with patient. Pt accepting of disposition.

## 2018-12-11 DIAGNOSIS — F332 Major depressive disorder, recurrent severe without psychotic features: Secondary | ICD-10-CM

## 2018-12-11 DIAGNOSIS — F4310 Post-traumatic stress disorder, unspecified: Secondary | ICD-10-CM | POA: Diagnosis present

## 2018-12-11 DIAGNOSIS — F429 Obsessive-compulsive disorder, unspecified: Secondary | ICD-10-CM | POA: Diagnosis present

## 2018-12-11 LAB — LIPID PANEL
Cholesterol: 172 mg/dL (ref 0–200)
HDL: 33 mg/dL — ABNORMAL LOW (ref >40)
LDL Cholesterol: 112 mg/dL — ABNORMAL HIGH (ref 0–99)
Total CHOL/HDL Ratio: 5.2 RATIO
Triglycerides: 137 mg/dL (ref <150)
VLDL: 27 mg/dL (ref 0–40)

## 2018-12-11 LAB — HEMOGLOBIN A1C
Hgb A1c MFr Bld: 4.8 % (ref 4.8–5.6)
Mean Plasma Glucose: 91.06 mg/dL

## 2018-12-11 LAB — TSH: TSH: 1.043 u[IU]/mL (ref 0.350–4.500)

## 2018-12-11 MED ORDER — PRAZOSIN HCL 1 MG PO CAPS
1.0000 mg | ORAL_CAPSULE | Freq: Two times a day (BID) | ORAL | Status: DC
  Administered 2018-12-11 – 2018-12-12 (×2): 1 mg via ORAL
  Filled 2018-12-11 (×3): qty 1

## 2018-12-11 NOTE — BHH Suicide Risk Assessment (Signed)
[facility] Admission Suicide Risk Assessment   Nursing information obtained from:  Patient Demographic factors:  Caucasian Current Mental Status:  Self-harm behaviors Loss Factors:  Loss of significant relationship Historical Factors:  Prior suicide attempts Risk Reduction Factors:  Living with another person, especially a relative, Positive therapeutic relationship  Total Time spent with patient: 1 hour Principal Problem: Severe recurrent major depression without psychotic features (HCC) Diagnosis:  Principal Problem:   Severe recurrent major depression without psychotic features (HCC) Active Problems:   Self-inflicted laceration of wrist  Subjective Data: suicide attempt  Continued Clinical Symptoms:  Alcohol Use Disorder Identification Test Final Score (AUDIT): 1 The "Alcohol Use Disorders Identification Test", Guidelines for Use in Primary Care, Second Edition.  World Science writer [facility]). Score between 0-7:  no or low risk or alcohol related problems. Score between 8-15:  moderate risk of alcohol related problems. Score between 16-19:  high risk of alcohol related problems. Score 20 or above:  warrants further diagnostic evaluation for alcohol dependence and treatment.   CLINICAL FACTORS:   Severe Anxiety and/or Agitation Depression:   Impulsivity   Musculoskeletal: Strength & Muscle Tone: within normal limits Gait & Station: normal Patient leans: N/A  Psychiatric Specialty Exam: Physical Exam  Nursing note and vitals reviewed. Psychiatric: His affect is inappropriate. His speech is rapid and/or pressured. He is hyperactive. Cognition and memory are normal. He expresses impulsivity. He expresses suicidal ideation.    Review of Systems  Neurological: Negative.   Psychiatric/Behavioral: Positive for depression and suicidal ideas. The patient is nervous/anxious.   All other systems reviewed and are negative.   Blood pressure 113/62, pulse 65, temperature 97.6 F (36.4  C), temperature source Oral, resp. rate 16, height 5' 6.5" (1.689 m), weight 83.9 kg, SpO2 99 %.Body mass index is 29.41 kg/m.  General Appearance: Casual  Eye Contact:  Good  Speech:  Pressured  Volume:  Increased  Mood:  Anxious and Euphoric  Affect:  Non-Congruent  Thought Process:  Goal Directed and Descriptions of Associations: Intact  Orientation:  Full (Time, Place, and Person)  Thought Content:  WDL  Suicidal Thoughts:  Yes.  with intent/plan  Homicidal Thoughts:  No  Memory:  Immediate;   Fair Recent;   Fair Remote;   Fair  Judgement:  Impaired  Insight:  Shallow  Psychomotor Activity:  Increased  Concentration:  Concentration: Fair and Attention Span: Fair  Recall:  [employer] of Knowledge:  Fair  Language:  Fair  Akathisia:  No  Handed:  Right  AIMS (if indicated):     Assets:  Communication Skills Desire for Improvement Financial Resources/Insurance Housing Physical Health Resilience Social Support Transportation Vocational/Educational  ADL's:  Intact  Cognition:  WNL  Sleep:         COGNITIVE FEATURES THAT CONTRIBUTE TO RISK:  None    SUICIDE RISK:   Moderate:  Frequent suicidal ideation with limited intensity, and duration, some specificity in terms of plans, no associated intent, good self-control, limited dysphoria/symptomatology, some risk factors present, and identifiable protective factors, including available and accessible social support.  PLAN OF CARE: hospital admission, medication management, discharge planning.  Tom Branch is a 22 year old male with a history of depression and PTSD admitted after suicide attempt by superficial cutting his wrist with s steak knife.   #Suicidal ideation -patient is able to contract for safety in the hospital  #Depression -contiue Abilify 10 mg daily  #PTSD -consider Minipress for nightmares and flashbacks  #Labs -lipid panel, TSH, A1C -EKG  #  Disposition   -discharge with family -follow up  with [facility]     I certify that inpatient services furnished can reasonably be expected to improve the patient's condition.   Kristine LineaJolanta Travoris Bushey, MD 12/11/2018, 11:23 AM

## 2018-12-11 NOTE — Progress Notes (Signed)
D: Patient stated slept good last night .Stated appetite fair and energy level high. Stated concentration  good . Stated on Depression scale 0 , hopeless 0 and anxiety 0 .( low 0-10 high) Denies suicidal  homicidal ideations  .  No auditory hallucinations  No pain concerns . Appropriate ADL'S. Interacting with peers and staff. Verbalizing  understanding of information received .Working on Arboriculturistcoping skills and decision making . Denies suicidal ideations . Interacting with peers and staff .  Voice no concerns around wake and sleep . No safety concerns . Able to verbalize understanding of medication received . Continue to attend  unit programming. Goal  [place]" Stay positive " Work on my issues "   A: Encourage patient participation with unit programming . Instruction  Given on  Medication , verbalize understanding. R: Voice no other concerns. Staff continue to monitor

## 2018-12-11 NOTE — Progress Notes (Signed)
Recreation Therapy Notes  INPATIENT RECREATION THERAPY ASSESSMENT  Patient Details Name: Tom Branch MRN: [id number] DOB: 09/25/1996 Today's Date: 12/11/2018       Information Obtained From: Patient  Able to Participate in Assessment/Interview: Yes  Patient Presentation: Responsive  Reason for Admission (Per Patient): Active Symptoms, Suicide Attempt  Patient Stressors:    Coping Skills:   Deep Breathing  Leisure Interests (2+):  Art - Paint, Nature - Hiking(biking, Kayaking)  Frequency of Recreation/Participation: Monthly  Awareness of Community Resources:  Yes  Community Resources:  [employer]  Current Use:    If no, Barriers?:    Expressed Interest in [employer] CorporationCommunity Resource Information:    [place]  Patient Main Form of Transportation: Car  Patient Strengths:  Smile, Friendly  Patient Identified Areas of Improvement:  My anxiety  Patient Goal for Hospitalization:  To work on my anxiety and medication  Current SI (including self-harm):  No  Current HI:  No  Current AVH: No  Staff Intervention Plan: Collaborate with Interdisciplinary Treatment Team, Group Attendance  Consent to Intern Participation: N/A  Lille Karim 12/11/2018, 3:36 PM

## 2018-12-11 NOTE — Progress Notes (Signed)
Recreation Therapy Notes   Date: 12/11/2018  Time: 9:30 am  Location: Craft Room  Behavioral response: Appropriate  Intervention Topic: Relaxation  Discussion/Intervention:  Group content today was focused on relaxation. The group defined relaxation and identified healthy ways to relax. Individuals expressed how much time they spend relaxing. Patients expressed how much their life would be if they did not make time for themselves to relax. The group stated ways they could improve their relaxation techniques in the future.  Individuals participated in the intervention "Time to Relax" where they had a chance to experience different relaxation techniques.  Clinical Observations/Feedback:  Patient came to group and defined relaxation as going to the beach, kayaking and going to the beach. He explained that he relaxes to try and take his mind off of things. Participant explained that relaxation is important so that people have a way to rebuild themselves. Individual was social with peers and staff while participating in the intervention.  Tom Branch LRT/CTRS          Tom Branch 12/11/2018 12:13 PM

## 2018-12-11 NOTE — [facility] Group Notes (Signed)
  [facility]  Date/Time: 12/11/18, 1300  Type of Therapy/Topic:  Group Therapy:  Emotion Regulation  Participation Level:  Active   Mood:pleasant  Description of Group:    The purpose of this group is to assist patients in learning to regulate negative emotions and experience positive emotions. Patients will be guided to discuss ways in which they have been vulnerable to their negative emotions. These vulnerabilities will be juxtaposed with experiences of positive emotions or situations, and patients challenged to use positive emotions to combat negative ones. Special emphasis will be placed on coping with negative emotions in conflict situations, and patients will process healthy conflict resolution skills.  Therapeutic Goals: 1. Patient will identify two positive emotions or experiences to reflect on in order to balance out negative emotions:  2. Patient will label two or more emotions that they find the most difficult to experience:  3. Patient will be able to demonstrate positive conflict resolution skills through discussion or role plays:   Summary of Patient Progress:Pt shared that anxiety is an emotion that is difficult to experience.  Pt made several comments during group discussion regarding positive ways to manage difficult emotions. Good participation, good comments.  Pt interacted with other group members appropriately.         Therapeutic Modalities:   Cognitive Behavioral Therapy Feelings Identification Dialectical Behavioral Therapy  Daleen SquibbGreg Witten Certain, LCSW

## 2018-12-11 NOTE — Progress Notes (Signed)
[facility] MD Progress Note  12/12/2018 2:41 PM Tom Branch  MRN:  [id number]  Subjective:    Tom Branch feels better and is nom longer suicidal. Mood is improving, affect still flat. He was able to participate in programing. Slept well. No somatic complaints. Tolerates Minipress well. Will increase his dose.  Principal Problem: Bipolar I disorder, most recent episode depressed, severe without psychotic features (HCC) Diagnosis: Principal Problem:   Bipolar I disorder, most recent episode depressed, severe without psychotic features (HCC) Active Problems:   Self-inflicted laceration of wrist   PTSD (post-traumatic stress disorder)   OCD (obsessive compulsive disorder)  Total Time spent with patient: 20 minutes  Past Psychiatric History: bipolar disorder  Past Medical History:  Past Medical History:  Diagnosis Date  . ADHD   . Adopted    no family medical hx  . Asthma   . Kidney stones   . Wears contact lenses     Past Surgical History:  Procedure Laterality Date  . LITHOTRIPSY    . TONSILLECTOMY N/A 12/13/2016   Procedure: TONSILLECTOMY;  Surgeon: Bud Face, MD;  Location: [facility];  Service: ENT;  Laterality: N/A;  . ULNAR NERVE TRANSPOSITION Left 10/09/2018   Procedure: SUBCUTANEOUS TRANSPOSITION OF THE ULNAR NERVE OF LEFT ELBOW;  Surgeon: Christena Flake, MD;  Location: [facility];  Service: Orthopedics;  Laterality: Left;  . WISDOM TOOTH EXTRACTION     Family History: History reviewed. No pertinent family history. Family Psychiatric  History: unknown Social History:  Social History   Substance and Sexual Activity  Alcohol Use No     Social History   Substance and Sexual Activity  Drug Use No    Social History   Socioeconomic History  . Marital status: Single    Spouse name: Not on file  . Number of children: Not on file  . Years of education: Not on file  . Highest education level: Not on file  Occupational History  . Not on  file  Social Needs  . Financial resource strain: Not on file  . Food insecurity:    Worry: Not on file    Inability: Not on file  . Transportation needs:    Medical: Not on file    Non-medical: Not on file  Tobacco Use  . Smoking status: Never Smoker  . Smokeless tobacco: Never Used  Substance and Sexual Activity  . Alcohol use: No  . Drug use: No  . Sexual activity: Not on file  Lifestyle  . Physical activity:    Days per week: Not on file    Minutes per session: Not on file  . Stress: Not on file  Relationships  . Social connections:    Talks on phone: Not on file    Gets together: Not on file    Attends religious service: Not on file    Active member of club or organization: Not on file    Attends meetings of clubs or organizations: Not on file    Relationship status: Not on file  Other Topics Concern  . Not on file  Social History Narrative  . Not on file   Additional Social History:                         Sleep: Fair  Appetite:  Fair  Current Medications: Current Facility-Administered Medications  Medication Dose Route Frequency Provider Last Rate Last Dose  . acetaminophen (TYLENOL) tablet 650 mg  650  mg Oral Q6H PRN Clapacs, John T, MD      . alum & mag hydroxide-simeth (MAALOX/MYLANTA) 200-200-20 MG/5ML suspension 30 mL  30 mL Oral Q4H PRN Clapacs, John T, MD      . ARIPiprazole (ABILIFY) tablet 10 mg  10 mg Oral Daily Clapacs, Jackquline Denmark, MD   10 mg at 12/12/18 1610  . fluvoxaMINE (LUVOX) tablet 50 mg  50 mg Oral QHS Pucilowska, Jolanta B, MD      . hydrOXYzine (ATARAX/VISTARIL) tablet 50 mg  50 mg Oral TID PRN Clapacs, John T, MD      . magnesium hydroxide (MILK OF MAGNESIA) suspension 30 mL  30 mL Oral Daily PRN Clapacs, John T, MD      . prazosin (MINIPRESS) capsule 2 mg  2 mg Oral BID Pucilowska, Jolanta B, MD        Lab Results:  Results for orders placed or performed during the hospital encounter of 12/10/18 (from the past 48 hour(s))   Hemoglobin A1c     Status: None   Collection Time: 12/11/18  7:06 AM  Result Value Ref Range   Hgb A1c MFr Bld 4.8 4.8 - 5.6 %    Comment: (NOTE) Pre diabetes:          5.7%-6.4% Diabetes:              >6.4% Glycemic control for   <7.0% adults with diabetes    Mean Plasma Glucose 91.06 mg/dL    Comment: Performed at [facility], [address], [place], [place] [address]  Lipid panel     Status: Abnormal   Collection Time: 12/11/18  7:06 AM  Result Value Ref Range   Cholesterol 172 0 - 200 mg/dL   Triglycerides 409 <811 mg/dL   HDL 33 (L) >91 mg/dL   Total CHOL/HDL Ratio 5.2 RATIO   VLDL 27 0 - 40 mg/dL   LDL Cholesterol 478 (H) 0 - 99 mg/dL    Comment:        Total Cholesterol/HDL:CHD Risk Coronary Heart Disease Risk Table                     Men   Women  1/2 Average Risk   3.4   3.3  Average Risk       5.0   4.4  2 X Average Risk   9.6   7.1  3 X Average Risk  23.4   11.0        Use the calculated Patient Ratio above and the CHD Risk Table to determine the patient's CHD Risk.        ATP III CLASSIFICATION (LDL):  <100     mg/dL   Optimal  295-621  mg/dL   Near or Above                    Optimal  130-159  mg/dL   Borderline  308-657  mg/dL   High  >846     mg/dL   Very High Performed at [facility], [address] [place], [place] [address]   TSH     Status: None   Collection Time: 12/11/18  7:06 AM  Result Value Ref Range   TSH 1.043 0.350 - 4.500 uIU/mL    Comment: Performed by a 3rd Generation assay with a functional sensitivity of <=0.01 uIU/mL. Performed at [facility], [address] [place], [place] [address]     Blood Alcohol  level:  Lab Results  Component Value Date   ETH <10 12/09/2018    Metabolic Disorder Labs: Lab Results  Component Value Date   HGBA1C 4.8 12/11/2018   MPG 91.06 12/11/2018   No results found for: PROLACTIN Lab Results  Component Value Date   CHOL 172 12/11/2018   TRIG 137 12/11/2018    HDL 33 (L) 12/11/2018   CHOLHDL 5.2 12/11/2018   VLDL 27 12/11/2018   LDLCALC 112 (H) 12/11/2018    Physical Findings: AIMS: Facial and Oral Movements Muscles of Facial Expression: None, normal Lips and Perioral Area: None, normal Jaw: None, normal Tongue: None, normal,Extremity Movements Upper (arms, wrists, hands, fingers): None, normal Lower (legs, knees, ankles, toes): None, normal, Trunk Movements Neck, shoulders, hips: None, normal, Overall Severity Severity of abnormal movements (highest score from questions above): None, normal Incapacitation due to abnormal movements: None, normal Patient's awareness of abnormal movements (rate only patient's report): No Awareness, Dental Status Current problems with teeth and/or dentures?: No Does patient usually wear dentures?: No  CIWA:    COWS:     Musculoskeletal: Strength & Muscle Tone: within normal limits Gait & Station: normal Patient leans: N/A  Psychiatric Specialty Exam: Physical Exam  Nursing note and vitals reviewed. Psychiatric: His affect is inappropriate. His speech is rapid and/or pressured. He is hyperactive. Cognition and memory are normal. He expresses impulsivity. He expresses suicidal ideation.    Review of Systems  Neurological: Negative.   Psychiatric/Behavioral: Positive for depression. The patient is nervous/anxious and has insomnia.   All other systems reviewed and are negative.   Blood pressure 118/78, pulse 83, temperature 97.9 F (36.6 C), temperature source Oral, resp. rate 18, height 5' 6.5" (1.689 m), weight 83.9 kg, SpO2 100 %.Body mass index is 29.41 kg/m.  General Appearance: Casual  Eye Contact:  Absent  Speech:  Pressured  Volume:  Increased  Mood:  Euphoric  Affect:  Congruent  Thought Process:  Goal Directed and Descriptions of Associations: Intact  Orientation:  Full (Time, Place, and Person)  Thought Content:  WDL  Suicidal Thoughts:  Yes.  without intent/plan  Homicidal Thoughts:   No  Memory:  Immediate;   Fair Recent;   Fair Remote;   Fair  Judgement:  Poor  Insight:  Shallow  Psychomotor Activity:  Increased  Concentration:  Concentration: Fair and Attention Span: Fair  Recall:  [employer]Fair  Fund of Knowledge:  Fair  Language:  Fair  Akathisia:  No  Handed:  Right  AIMS (if indicated):     Assets:  Communication Skills Desire for Improvement Financial Resources/Insurance Housing Physical Health Resilience Social Support Talents/Skills Transportation Vocational/Educational  ADL's:  Intact  Cognition:  WNL  Sleep:  Number of Hours: 7.5     Treatment Plan Summary: Daily contact with patient to assess and evaluate symptoms and progress in treatment and Medication management   Tom Branch is a 22 year old male with a history of depression and PTSD admitted after suicide attempt by superficial cutting his wrist with s steak knife.   #Suicidal ideation, resolved -patient is able to contract for safety in the hospital  #Depression, improving -contiue Abilify 10 mg daily -start Luvox 50 mg nightly  #PTSD -increase Minipress to 2 mg BID for nightmares and flashbacks  #Labs -lipid panel, TSH, A1C are normal -EKG reviewed, NSR with QTc 383  #Disposition   -discharge with family -follow up with [facility]    Kristine LineaJolanta Pucilowska, MD 12/12/2018, 2:41 PM

## 2018-12-11 NOTE — Plan of Care (Signed)
Working on Arboriculturistcoping skills and decision making . Denies suicidal ideations . Interacting  with peers and staff  .   Voice no concerns around wake and sleep . No safety concerns . Able to verbalize understanding of medication received . Continue to attend  unit programming. Verbalizing  understanding of information received .   Problem: Medication: Goal: Compliance with prescribed medication regimen will improve Outcome: Progressing   Problem: Education: Goal: Knowledge of [facility] Education information/materials will improve Outcome: Progressing Goal: Emotional status will improve Outcome: Progressing Goal: Mental status will improve Outcome: Progressing Goal: Verbalization of understanding the information provided will improve Outcome: Progressing   Problem: Health Behavior/Discharge Planning: Goal: Identification of resources available to assist in meeting health care needs will improve Outcome: Progressing Goal: Compliance with treatment plan for underlying cause of condition will improve Outcome: Progressing   Problem: Safety: Goal: Periods of time without injury will increase Outcome: Progressing   Problem: Education: Goal: Utilization of techniques to improve thought processes will improve Outcome: Progressing Goal: Knowledge of the prescribed therapeutic regimen will improve Outcome: Progressing   Problem: Activity: Goal: Interest or engagement in leisure activities will improve Outcome: Progressing Goal: Imbalance in normal sleep/wake cycle will improve Outcome: Progressing   Problem: Coping: Goal: Coping ability will improve Outcome: Progressing Goal: Will verbalize feelings Outcome: Progressing   Problem: Self-Concept: Goal: Will verbalize positive feelings about self Outcome: Progressing Goal: Level of anxiety will decrease Outcome: Progressing   Problem: Medication: Goal: Compliance with prescribed medication regimen will improve Outcome:  Progressing   Problem: Self-Concept: Goal: Ability to disclose and discuss suicidal ideas will improve Outcome: Progressing Goal: Will verbalize positive feelings about self Outcome: Progressing   Problem: Coping: Goal: Ability to identify and develop effective coping behavior will improve Outcome: Progressing Goal: Ability to interact with others will improve Outcome: Progressing Goal: Demonstration of participation in decision-making regarding own care will improve Outcome: Progressing Goal: Ability to use eye contact when communicating with others will improve Outcome: Progressing

## 2018-12-11 NOTE — H&P (Addendum)
Psychiatric Admission Assessment Adult  Patient Identification: Tom Branch MRN:  [id number] Date of Evaluation:  12/11/2018 Chief Complaint:  MDD Principal Diagnosis: Severe recurrent major depression without psychotic features (HCC) Diagnosis:  Principal Problem:   Severe recurrent major depression without psychotic features (HCC) Active Problems:   Self-inflicted laceration of wrist  History of Present Illness:   Identifying data. Tom Branch is a 22 year old male with a history of depression and PTSD.  Chief complaint. "This was the bracelet."  History of present illness. Information was obtained from the patient and the chart. The patient was brought to the hospital by his mother who caught the patient superficially cutting his skin at the elbow. She initially hid all the knifes but the patient round another one to continue his job. The patient believes that this attempt was triggered by memory of his friend and fellow soldier who committed suicide during training four months ago. He blew himself with a grenade causing multiple injuries to people around him including the patient. Reportedly pieces of his bone became inserted on the patient's elbow area requiring surgery. The suicide victim wrote a note indicating that he wanted the patient to keep his bracelet in the case of death. Indeed, the bracelet was removed from the severed arm of the victim and given to the patient. On the day of attempt, he put it on, had a flashback and decided to end hid life.  He does not list any additional stressors. He is in a relationship and happy, likes his job at [employer], has supportive family. For the past two months, he has been seeing Dr[facility] Car[facility]et [facility] who prescribes Abilify 10 mg daily with improvement in depressive symptoms. He also sees a therapist there. He denies any problems with substances. UDS is negative.   He is very pleasant and engaging during the interview. He does not strike me  as depressed at all. Smiles al the time, is restless, hyper. Speech is loud and pressured. He denies any problems except insomnia which he believes is from Abilify which he takes in the morning. He claims to be always hyper.  Past psychiatric history. 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 He attempted suicide recently by overdose on Tylenol and was admitted to [facility], given diagnosis of bipolar disorder and started on Abilify.  Family psychiatric history. Unknown, patient is adopted.  Social history. He comes from a military family with his both grandfathers and the mother serving during conflicts. Military career was his goal. Has private insurance.  ATotal Time spent with patient: 1 hour  Is the patient at risk to self? Yes.    Has the patient been a risk to self in the past 6 months? Yes.    Has the patient been a risk to self within the distant past? No.  Is the patient a risk to others? No.  Has the patient been a risk to others in the past 6 months? No.  Has the patient been a risk to others within the distant past? No.   Prior Inpatient Therapy:   Prior Outpatient Therapy:    Alcohol Screening: 1. How often do you have a drink containing alcohol?: Monthly or less 2. How many drinks containing alcohol do you have on a typical day when you are drinking?: 1 or 2 3. How often do you have six  or more drinks on one occasion?: Never AUDIT-C Score: 1 9. Have you or someone else been injured as a result of your drinking?: No 10. Has a relative or friend or a doctor or another health worker been concerned about your drinking or suggested you cut down?: No Alcohol Use Disorder Identification Test Final Score (AUDIT): 1 Intervention/Follow-up: AUDIT Score <7 follow-up not indicated Substance Abuse History in the last 12 months:  No. Consequences of  Substance Abuse: NA Previous Psychotropic Medications: Yes  Psychological Evaluations: No  Past Medical History:  Past Medical History:  Diagnosis Date  . ADHD   . Adopted    no family medical hx  . Asthma   . Kidney stones   . Wears contact lenses     Past Surgical History:  Procedure Laterality Date  . LITHOTRIPSY    . TONSILLECTOMY N/A 12/13/2016   Procedure: TONSILLECTOMY;  Surgeon: Bud Face, MD;  Location: [facility];  Service: ENT;  Laterality: N/A;  . ULNAR NERVE TRANSPOSITION Left 10/09/2018   Procedure: SUBCUTANEOUS TRANSPOSITION OF THE ULNAR NERVE OF LEFT ELBOW;  Surgeon: Christena Flake, MD;  Location: [facility];  Service: Orthopedics;  Laterality: Left;  . WISDOM TOOTH EXTRACTION     Family History: History reviewed. No pertinent family history.  Tobacco Screening: Have you used any form of tobacco in the last 30 days? (Cigarettes, Smokeless Tobacco, Cigars, and/or Pipes): No Social History:  Social History   Substance and Sexual Activity  Alcohol Use No     Social History   Substance and Sexual Activity  Drug Use No    Additional Social History:                           Allergies:   Allergies  Allergen Reactions  . Dilaudid [Hydromorphone Hcl] Hives  . Latex Hives    Gloves (when worn)  . Lactose Intolerance (Gi)   . Meat Extract Hives    Red meat  . Other     Bleach, Neosporin: Rash   Lab Results:  Results for orders placed or performed during the hospital encounter of 12/10/18 (from the past 48 hour(s))  Hemoglobin A1c     Status: None   Collection Time: 12/11/18  7:06 AM  Result Value Ref Range   Hgb A1c MFr Bld 4.8 4.8 - 5.6 %    Comment: (NOTE) Pre diabetes:          5.7%-6.4% Diabetes:              >6.4% Glycemic control for   <7.0% adults with diabetes    Mean Plasma Glucose 91.06 mg/dL    Comment: Performed at [facility], [address], [place], [place] [address]  Lipid panel      Status: Abnormal   Collection Time: 12/11/18  7:06 AM  Result Value Ref Range   Cholesterol 172 0 - 200 mg/dL   Triglycerides 604 <540 mg/dL   HDL 33 (L) >98 mg/dL   Total CHOL/HDL Ratio 5.2 RATIO   VLDL 27 0 - 40 mg/dL   LDL Cholesterol 119 (H) 0 - 99 mg/dL    Comment:        Total Cholesterol/HDL:CHD Risk Coronary Heart Disease Risk Table                     Men   Women  1/2 Average Risk   3.4   3.3  Average Risk       5.0   4.4  2 X Average Risk   9.6   7.1  3 X Average Risk  23.4   11.0        Use the calculated Patient Ratio above and the CHD Risk Table to determine the patient's CHD Risk.        ATP III CLASSIFICATION (LDL):  <100     mg/dL   Optimal  161-096  mg/dL   Near or Above                    Optimal  130-159  mg/dL   Borderline  045-409  mg/dL   High  >811     mg/dL   Very High Performed at [facility], [address] [place], [place] [address]   TSH     Status: None   Collection Time: 12/11/18  7:06 AM  Result Value Ref Range   TSH 1.043 0.350 - 4.500 uIU/mL    Comment: Performed by a 3rd Generation assay with a functional sensitivity of <=0.01 uIU/mL. Performed at [facility], [address] [place], [place] [address]     Blood Alcohol level:  Lab Results  Component Value Date   [facility] <10 12/09/2018    Metabolic Disorder Labs:  Lab Results  Component Value Date   HGBA1C 4.8 12/11/2018   MPG 91.06 12/11/2018   No results found for: PROLACTIN Lab Results  Component Value Date   CHOL 172 12/11/2018   TRIG 137 12/11/2018   HDL 33 (L) 12/11/2018   CHOLHDL 5.2 12/11/2018   VLDL 27 12/11/2018   LDLCALC 112 (H) 12/11/2018    Current Medications: Current Facility-Administered Medications  Medication Dose Route Frequency Provider Last Rate Last Dose  . acetaminophen (TYLENOL) tablet 650 mg  650 mg Oral Q6H PRN Clapacs, John T, MD      . alum & mag hydroxide-simeth (MAALOX/MYLANTA) 200-200-20 MG/5ML suspension 30 mL  30 mL  Oral Q4H PRN Clapacs, John T, MD      . ARIPiprazole (ABILIFY) tablet 10 mg  10 mg Oral Daily Clapacs, John T, MD   10 mg at 12/11/18 0813  . hydrOXYzine (ATARAX/VISTARIL) tablet 50 mg  50 mg Oral TID PRN Clapacs, John T, MD      . magnesium hydroxide (MILK OF MAGNESIA) suspension 30 mL  30 mL Oral Daily PRN Clapacs, John T, MD      . traZODone (DESYREL) tablet 100 mg  100 mg Oral QHS PRN Clapacs, Jackquline Denmark, MD       PTA Medications: Medications Prior to Admission  Medication Sig Dispense Refill Last Dose  . albuterol (PROVENTIL HFA;VENTOLIN HFA) 108 (90 Base) MCG/ACT inhaler Inhale into the lungs every 6 (six) hours as needed for wheezing or shortness of breath.   prn at prn  . ARIPiprazole (ABILIFY) 10 MG tablet Take 10 mg by mouth daily.   12/09/2018 at 0800  . EPINEPHrine (EPIPEN 2-PAK) 0.3 mg/0.3 mL IJ SOAJ injection Inject 0.3 mLs (0.3 mg total) into the muscle once. 1 Device 0 prn at prn  . esomeprazole (NEXIUM) 20 MG capsule Take 20 mg by mouth daily at 12 noon.   prn at prn  . ibuprofen (ADVIL,MOTRIN) 200 MG tablet Take 4 tablets by mouth as needed.   prn at prn  . loratadine (CLARITIN) 10 MG tablet Take 1 tablet by mouth as needed.   prn at prn  . Melatonin 3  MG TABS Take 12 mg by mouth at bedtime as needed.   Past Month at prn  . methylphenidate 36 MG PO CR tablet Take 72 mg by mouth daily.    12/09/2018 at 0800  . Multiple Vitamin (MULTI-VITAMINS) TABS Take 1 tablet by mouth daily.   Past Week at Unknown time  . naproxen sodium (ALEVE) 220 MG tablet Take 1 tablet by mouth 3 (three) times daily as needed.   prn at prn  . Omega-3 Fatty Acids (FISH OIL PO) Take by mouth daily.   12/09/2018 at 0800  . oxyCODONE (ROXICODONE) 5 MG immediate release tablet Take 1-2 tablets (5-10 mg total) by mouth every 4 (four) hours as needed for severe pain. (Patient not taking: Reported on 12/10/2018) 40 tablet 0 Not Taking at Unknown time    Musculoskeletal: Strength & Muscle Tone: within normal  limits Gait & Station: normal Patient leans: N/A  Psychiatric Specialty Exam: I reviewed physical examination performed in the ER and agree with the findings. Physical Exam  Nursing note and vitals reviewed. Psychiatric: His affect is labile and inappropriate. His speech is rapid and/or pressured. He is hyperactive. Cognition and memory are impaired. He expresses impulsivity. He expresses suicidal ideation.    Review of Systems  Neurological: Negative.   Psychiatric/Behavioral: Positive for depression and suicidal ideas. The patient is nervous/anxious and has insomnia.   All other systems reviewed and are negative.   Blood pressure 113/62, pulse 65, temperature 97.6 F (36.4 C), temperature source Oral, resp. rate 16, height 5' 6.5" (1.689 m), weight 83.9 kg, SpO2 99 %.Body mass index is 29.41 kg/m.  See SRA                                                  Sleep:       Treatment Plan Summary: Daily contact with patient to assess and evaluate symptoms and progress in treatment and Medication management   Mr. Mosley is a 22 year old male with a history of depression and PTSD admitted after suicide attempt by superficial cutting his wrist with s steak knife.   #Suicidal ideation -patient is able to contract for safety in the hospital  #Depression -contiue Abilify 10 mg daily  #PTSD -consider Minipress for nightmares and flashbacks  #Labs -lipid panel, TSH, A1C -EKG  #Disposition   -discharge with family -follow up with [facility]    Observation Level/Precautions:  15 minute checks  Laboratory:  CBC Chemistry Profile UDS UA  Psychotherapy:    Medications:    Consultations:    Discharge Concerns:    Estimated LOS:  Other:     Physician Treatment Plan for Primary Diagnosis: Severe recurrent major depression without psychotic features (HCC) Long Term Goal(s): Improvement in symptoms so as ready for discharge  Short Term Goals: Ability to  identify changes in lifestyle to reduce recurrence of condition will improve, Ability to verbalize feelings will improve, Ability to disclose and discuss suicidal ideas, Ability to demonstrate self-control will improve, Ability to identify and develop effective coping behaviors will improve, Ability to maintain clinical measurements within normal limits will improve and Ability to identify triggers associated with substance abuse/mental health issues will improve  Physician Treatment Plan for Secondary Diagnosis: Principal Problem:   Severe recurrent major depression without psychotic features (HCC) Active Problems:   Self-inflicted laceration of wrist  Long Term Goal(s): Improvement  in symptoms so as ready for discharge  Short Term Goals: NA  I certify that inpatient services furnished can reasonably be expected to improve the patient's condition.    Kristine Linea, MD 12/18/201912:01 PM

## 2018-12-11 NOTE — Tx Team (Addendum)
Interdisciplinary Treatment and Diagnostic Plan Update  12/11/2018 Time of Session: 1030am Tom Branch MRN: [id number]  Principal Diagnosis: Severe recurrent major depression without psychotic features [facility])  Secondary Diagnoses: Principal Problem:   Severe recurrent major depression without psychotic features (HCC) Active Problems:   Self-inflicted laceration of wrist   Current Medications:  Current Facility-Administered Medications  Medication Dose Route Frequency Provider Last Rate Last Dose  . acetaminophen (TYLENOL) tablet 650 mg  650 mg Oral Q6H PRN Clapacs, John T, MD      . alum & mag hydroxide-simeth (MAALOX/MYLANTA) 200-200-20 MG/5ML suspension 30 mL  30 mL Oral Q4H PRN Clapacs, John T, MD      . ARIPiprazole (ABILIFY) tablet 10 mg  10 mg Oral Daily Clapacs, John T, MD   10 mg at 12/11/18 0813  . hydrOXYzine (ATARAX/VISTARIL) tablet 50 mg  50 mg Oral TID PRN Clapacs, John T, MD      . magnesium hydroxide (MILK OF MAGNESIA) suspension 30 mL  30 mL Oral Daily PRN Clapacs, John T, MD      . traZODone (DESYREL) tablet 100 mg  100 mg Oral QHS PRN Clapacs, Jackquline Denmark, MD       PTA Medications: Medications Prior to Admission  Medication Sig Dispense Refill Last Dose  . albuterol (PROVENTIL HFA;VENTOLIN HFA) 108 (90 Base) MCG/ACT inhaler Inhale into the lungs every 6 (six) hours as needed for wheezing or shortness of breath.   prn at prn  . ARIPiprazole (ABILIFY) 10 MG tablet Take 10 mg by mouth daily.   12/09/2018 at 0800  . EPINEPHrine (EPIPEN 2-PAK) 0.3 mg/0.3 mL IJ SOAJ injection Inject 0.3 mLs (0.3 mg total) into the muscle once. 1 Device 0 prn at prn  . esomeprazole (NEXIUM) 20 MG capsule Take 20 mg by mouth daily at 12 noon.   prn at prn  . ibuprofen (ADVIL,MOTRIN) 200 MG tablet Take 4 tablets by mouth as needed.   prn at prn  . loratadine (CLARITIN) 10 MG tablet Take 1 tablet by mouth as needed.   prn at prn  . Melatonin 3 MG TABS Take 12 mg by mouth at bedtime as needed.    Past Month at prn  . methylphenidate 36 MG PO CR tablet Take 72 mg by mouth daily.    12/09/2018 at 0800  . Multiple Vitamin (MULTI-VITAMINS) TABS Take 1 tablet by mouth daily.   Past Week at Unknown time  . naproxen sodium (ALEVE) 220 MG tablet Take 1 tablet by mouth 3 (three) times daily as needed.   prn at prn  . Omega-3 Fatty Acids (FISH OIL PO) Take by mouth daily.   12/09/2018 at 0800  . oxyCODONE (ROXICODONE) 5 MG immediate release tablet Take 1-2 tablets (5-10 mg total) by mouth every 4 (four) hours as needed for severe pain. (Patient not taking: Reported on 12/10/2018) 40 tablet 0 Not Taking at Unknown time    Patient Stressors: Health problems Traumatic event  Patient Strengths: Capable of independent living Licensed conveyancer Motivation for treatment/growth Religious Affiliation Supportive family/friends Work skills  Treatment Modalities: Medication Management, Group therapy, Case management,  1 to 1 session with clinician, Psychoeducation, Recreational therapy.   Physician Treatment Plan for Primary Diagnosis: Severe recurrent major depression without psychotic features (HCC) Long Term Goal(s):     Short Term Goals:    Medication Management: Evaluate patient's response, side effects, and tolerance of medication regimen.  Therapeutic Interventions: 1 to 1 sessions, Unit Group sessions and Medication administration.  Evaluation of Outcomes: Progressing  Physician Treatment Plan for Secondary Diagnosis: Principal Problem:   Severe recurrent major depression without psychotic features (HCC) Active Problems:   Self-inflicted laceration of wrist  Long Term Goal(s):     Short Term Goals:       Medication Management: Evaluate patient's response, side effects, and tolerance of medication regimen.  Therapeutic Interventions: 1 to 1 sessions, Unit Group sessions and Medication administration.  Evaluation of Outcomes: Progressing   RN Treatment  Plan for Primary Diagnosis: Severe recurrent major depression without psychotic features (HCC) Long Term Goal(s): Knowledge of disease and therapeutic regimen to maintain health will improve  Short Term Goals: Ability to verbalize feelings will improve, Ability to disclose and discuss suicidal ideas, Ability to identify and develop effective coping behaviors will improve and Compliance with prescribed medications will improve  Medication Management: RN will administer medications as ordered by provider, will assess and evaluate patient's response and provide education to patient for prescribed medication. RN will report any adverse and/or side effects to prescribing provider.  Therapeutic Interventions: 1 on 1 counseling sessions, Psychoeducation, Medication administration, Evaluate responses to treatment, Monitor vital signs and CBGs as ordered, Perform/monitor CIWA, COWS, AIMS and Fall Risk screenings as ordered, Perform wound care treatments as ordered.  Evaluation of Outcomes: Progressing   LCSW Treatment Plan for Primary Diagnosis: Severe recurrent major depression without psychotic features (HCC) Long Term Goal(s): Safe transition to appropriate next level of care at discharge, Engage patient in therapeutic group addressing interpersonal concerns.  Short Term Goals: Engage patient in aftercare planning with referrals and resources  Therapeutic Interventions: Assess for all discharge needs, 1 to 1 time with Social worker, Explore available resources and support systems, Assess for adequacy in community support network, Educate family and significant other(s) on suicide prevention, Complete Psychosocial Assessment, Interpersonal group therapy.  Evaluation of Outcomes: Progressing   Progress in Treatment: Attending groups: Yes Participating in groups: Yes. Taking medication as prescribed: Yes. Toleration medication: Yes. Family/Significant other contact made: No, will contact:  CSW will  contact when pt gives consent Patient understands diagnosis: Yes. Discussing patient identified problems/goals with staff: Yes. Medical problems stabilized or resolved: Yes. Denies suicidal/homicidal ideation: Yes. Issues/concerns per patient self-inventory: No. Other: NA  New problem(s) identified: No, Describe:  None reported  New Short Term/Long Term Goal(s):"Find better coping methods"  Patient Goals:  "Find better coping methods"  Discharge Plan or Barriers: Pt will return home and follow up with outpatient treatment  Reason for Continuation of Hospitalization: Medication stabilization  Estimated Length of Stay: 3-5 days  Recreational Therapy: Patient Stressors: N/A Patient Goal: Patient will identify 3 triggers to anxiety within 5 recreation therapy group sessions  Attendees: Patient:Tom Branch 12/11/2018 11:11 AM  Physician: Lilia ProJolenta Pucilowska, MD 12/11/2018 11:11 AM  Nursing:  12/11/2018 11:11 AM  RN Care Manager: 12/11/2018 11:11 AM  Social Worker: Lowella Dandyarren Livingston LCSW 12/11/2018 11:11 AM  Recreational Therapist: Garret ReddishShay Isatu Macinnes LRT 12/11/2018 11:11 AM  Other: Daleen SquibbGreg Wierda LCSW 12/11/2018 11:11 AM  Other:  12/11/2018 11:11 AM  Other: 12/11/2018 11:11 AM    Scribe for Treatment Team: Suzan SlickARREN T LIVINGSTON, LCSW 12/11/2018 11:11 AM
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
## 2018-12-12 MED ORDER — PRAZOSIN HCL 2 MG PO CAPS
2.0000 mg | ORAL_CAPSULE | Freq: Two times a day (BID) | ORAL | Status: DC
  Administered 2018-12-12 – 2018-12-16 (×8): 2 mg via ORAL
  Filled 2018-12-12 (×8): qty 1

## 2018-12-12 MED ORDER — FLUVOXAMINE MALEATE 50 MG PO TABS
50.0000 mg | ORAL_TABLET | Freq: Every day | ORAL | Status: DC
  Administered 2018-12-12: 50 mg via ORAL
  Filled 2018-12-12: qty 1

## 2018-12-12 NOTE — BHH Group Notes (Signed)
Balance In Life 12/12/2018 1PM  Type of Therapy/Topic:  Group Therapy:  Balance in Life  Participation Level:  Active  Description of Group:   This group will address the concept of balance and how it feels and looks when one is unbalanced. Patients will be encouraged to process areas in their lives that are out of balance and identify reasons for remaining unbalanced. Facilitators will guide patients in utilizing problem-solving interventions to address and correct the stressor making their life unbalanced. Understanding and applying boundaries will be explored and addressed for obtaining and maintaining a balanced life. Patients will be encouraged to explore ways to assertively make their unbalanced needs known to significant others in their lives, using other group members and facilitator for support and feedback.  Therapeutic Goals: 1. Patient will identify two or more emotions or situations they have that consume much of in their lives. 2. Patient will identify signs/triggers that life has become out of balance:  3. Patient will identify two ways to set boundaries in order to achieve balance in their lives:  4. Patient will demonstrate ability to communicate their needs through discussion and/or role plays  Summary of Patient Progress: Pt actively participated in today's session. Pt communicated appropriately with group members and offered feedback on things he does to find balance such as "positive thinking and going with the flow."   Therapeutic Modalities:   Cognitive Behavioral Therapy Solution-Focused Therapy Assertiveness Training  Donovan Persley Philip Aspen Kamali Nephew, LCSW

## 2018-12-12 NOTE — Progress Notes (Signed)
D: Patient stated slept good last night .Stated appetite fair and energy level  hyper. Stated concentration is good . Stated on Depression scale 8, hopeless 8 and anxiety 8 .( low 0-10 high) Denies suicidal  homicidal ideations  .  No auditory hallucinations  No pain concerns . Appropriate ADL'S. Interacting with peers and staff. Working on Arboriculturistcoping skills and decision making . Denies suicidal ideations . Interacting  with peers and staff  .   Voice no concerns around wake and sleep . No safety concerns . Able to verbalize understanding of medication received . Continue to attend  unit programming. Verbalizing  understanding of information received  . Voice of his focus " Go Home "  A: Encourage patient participation with unit programming . Instruction  Given on  Medication , verbalize understanding. R: Voice no other concerns. Staff continue to monitor

## 2018-12-12 NOTE — Plan of Care (Signed)
Working on Arboriculturistcoping skills and decision making . Denies suicidal ideations . Interacting  with peers and staff  .   Voice no concerns around wake and sleep . No safety concerns . Able to verbalize understanding of medication received . Continue to attend  unit programming. Verbalizing  understanding of information received     Problem: Education: Goal: Knowledge of [place] [facility] Education information/materials will improve Outcome: Progressing Goal: Emotional status will improve Outcome: Progressing Goal: Mental status will improve Outcome: Progressing Goal: Verbalization of understanding the information provided will improve Outcome: Progressing   Problem: Health Behavior/Discharge Planning: Goal: Identification of resources available to assist in meeting health care needs will improve Outcome: Progressing Goal: Compliance with treatment plan for underlying cause of condition will improve Outcome: Progressing   Problem: Safety: Goal: Periods of time without injury will increase Outcome: Progressing   Problem: Education: Goal: Utilization of techniques to improve thought processes will improve Outcome: Progressing Goal: Knowledge of the prescribed therapeutic regimen will improve Outcome: Progressing   Problem: Activity: Goal: Interest or engagement in leisure activities will improve Outcome: Progressing Goal: Imbalance in normal sleep/wake cycle will improve Outcome: Progressing   Problem: Coping: Goal: Coping ability will improve Outcome: Progressing Goal: Will verbalize feelings Outcome: Progressing   Problem: Self-Concept: Goal: Will verbalize positive feelings about self Outcome: Progressing Goal: Level of anxiety will decrease Outcome: Progressing   Problem: Medication: Goal: Compliance with prescribed medication regimen will improve Outcome: Progressing   Problem: Self-Concept: Goal: Ability to disclose and discuss suicidal ideas will improve Outcome:  Progressing Goal: Will verbalize positive feelings about self Outcome: Progressing   Problem: Coping: Goal: Ability to identify and develop effective coping behavior will improve Outcome: Progressing Goal: Ability to interact with others will improve Outcome: Progressing Goal: Demonstration of participation in decision-making regarding own care will improve Outcome: Progressing Goal: Ability to use eye contact when communicating with others will improve Outcome: Progressing

## 2018-12-12 NOTE — Progress Notes (Signed)
Recreation Therapy Notes  Date: 12/12/2018  Time: 9:30 am  Location: Craft Room  Behavioral response: Appropriate  Intervention Topic: Self-esteem  Discussion/Intervention:  Group content today was focused on self-esteem. Patient defined self-esteem and where it comes form. The group described reasons self-esteem is important. Individuals stated things that impact self-esteem and positive ways to improve self-esteem. The group participated in the intervention "Collage of Me" where patients were able to create a collage of positive things that makes them who they are. Clinical Observations/Feedback:  Patient came to group and defined self-esteem as being confident in yourself. He stated self-esteem makes you who you are. Participant becomes hyperverbal and hyperactive when around certain peers. Individual was social with peers and staff while participating in the intervention.  Shalae Belmonte LRT/CTRS         Jules Baty 12/12/2018 11:35 AM

## 2018-12-13 DIAGNOSIS — F909 Attention-deficit hyperactivity disorder, unspecified type: Secondary | ICD-10-CM | POA: Diagnosis present

## 2018-12-13 DIAGNOSIS — F332 Major depressive disorder, recurrent severe without psychotic features: Secondary | ICD-10-CM

## 2018-12-13 MED ORDER — FLUVOXAMINE MALEATE 100 MG PO TABS: 100.0000 mg | ORAL_TABLET | Freq: Every day | ORAL | 1 refills | Status: DC

## 2018-12-13 MED ORDER — METHYLPHENIDATE HCL ER (OSM) 36 MG PO TBCR
36.0000 mg | EXTENDED_RELEASE_TABLET | Freq: Every day | ORAL | Status: DC
  Administered 2018-12-14 – 2018-12-16 (×3): 36 mg via ORAL
  Filled 2018-12-13 (×3): qty 1

## 2018-12-13 MED ORDER — ARIPIPRAZOLE 10 MG PO TABS: 10.0000 mg | ORAL_TABLET | Freq: Every day | ORAL | 1 refills | Status: DC

## 2018-12-13 MED ORDER — FLUVOXAMINE MALEATE 50 MG PO TABS
100.0000 mg | ORAL_TABLET | Freq: Every day | ORAL | Status: DC
  Administered 2018-12-13 – 2018-12-15 (×3): 100 mg via ORAL
  Filled 2018-12-13 (×3): qty 2

## 2018-12-13 MED ORDER — ESZOPICLONE 2 MG PO TABS: 2.0000 mg | ORAL_TABLET | Freq: Every day | ORAL | 0 refills | Status: DC

## 2018-12-13 MED ORDER — PRAZOSIN HCL 2 MG PO CAPS: 2.0000 mg | ORAL_CAPSULE | Freq: Two times a day (BID) | ORAL | 1 refills | Status: DC

## 2018-12-13 MED ORDER — METHYLPHENIDATE HCL ER (OSM) 27 MG PO TBCR: 54.0000 mg | EXTENDED_RELEASE_TABLET | Freq: Every day | ORAL | Status: DC

## 2018-12-13 NOTE — BHH Suicide Risk Assessment (Addendum)
[facility]   Principal Problem: Bipolar I disorder, most recent episode depressed, severe without psychotic features [facility](HCC) Discharge Diagnoses: Principal Problem:   Bipolar I disorder, most recent episode depressed, severe without psychotic features (HCC) Active Problems:   Self-inflicted laceration of wrist   PTSD (post-traumatic stress disorder)   OCD (obsessive compulsive disorder)   Attention deficit hyperactivity disorder (ADHD)   Severe recurrent major depression without psychotic features (HCC)   Total Time spent with patient: 20 minutes  Musculoskeletal: Strength & Muscle Tone: within normal limits Gait & Station: normal Patient leans: N/A  Psychiatric Specialty Exam: Review of Systems  Neurological: Negative.   Psychiatric/Behavioral: Negative.   All other systems reviewed and are negative.   Blood pressure 117/69, pulse 65, temperature 99 F (37.2 C), temperature source Oral, resp. rate 16, height 5' 6.5" (1.689 m), weight 83.9 kg, SpO2 99 %.Body mass index is 29.41 kg/m.  General Appearance: Casual  Eye Contact::  Good  Speech:  Clear and Coherent409  Volume:  Normal  Mood:  Euthymic  Affect:  Appropriate  Thought Process:  Goal Directed and Descriptions of Associations: Intact  Orientation:  Full (Time, Place, and Person)  Thought Content:  WDL  Suicidal Thoughts:  No  Homicidal Thoughts:  No  Memory:  Immediate;   Fair Recent;   Fair Remote;   Fair  Judgement:  Impaired  Insight:  Shallow  Psychomotor Activity:  Normal  Concentration:  Fair  Recall:  FiservFair  Fund of Knowledge:Fair  Language: Fair  Akathisia:  No  Handed:  Right  AIMS (if indicated):     Assets:  Communication Skills Desire for Improvement Financial Resources/Insurance Housing Intimacy Physical Health Resilience Social Support Transportation Vocational/Educational  Sleep:  Number of Hours: 7.15  Cognition: WNL  ADL's:  Intact   Mental Status Per  Nursing Assessment::   On Admission:  Self-harm behaviors  Demographic Factors:  Male and Caucasian  Loss Factors: Loss of significant relationship  Historical Factors: Impulsivity  Risk Reduction Factors:   Sense of responsibility to family, Employed, Living with another person, especially a relative, Positive social support and Positive therapeutic relationship  Continued Clinical Symptoms:  Bipolar Disorder:   Depressive phase Obsessive-Compulsive Disorder  Cognitive Features That Contribute To Risk:  None    Suicide Risk:  Minimal: No identifiable suicidal ideation.  Patients presenting with no risk factors but with morbid ruminations; may be classified as minimal risk based on the severity of the depressive symptoms  Follow-up Information    [facility] Go on 12/19/2018.   Why:  Please go to [facility] on Thursday, December 26th at 930am for a hospital follow up appointment. You are scheduled to meet with Dr. Marguerite OleaMoffett on Monday, December 23, 2018 at 1240pm. Thank you. Contact information: [address] [place] [id number] [phone]           Plan Of Care/Follow-up recommendations:  Activity:  as tolerated Diet:  regular Other:  keep follow up appointments  Kristine LineaJolanta Jaimon Bugaj, MD 12/16/2018, 9:19 AM

## 2018-12-13 NOTE — Plan of Care (Signed)
Patient is calm and cooperating with care of ADLs behavior is appropriate speech is clear, orientation is good , responses to medications is good , mood and affect is good, patient states that he is waiting to go back to working at [employer], patient is socializing with peers with out any issues , expresses no concerns , contract for safety and 15 minute safety checks is in progress, no distress, sleep long hours with out disturbances .   Problem: Education: Goal: Knowledge of [place] [facility] Education information/materials will improve Outcome: Progressing Goal: Emotional status will improve Outcome: Progressing Goal: Mental status will improve Outcome: Progressing Goal: Verbalization of understanding the information provided will improve Outcome: Progressing   Problem: Health Behavior/Discharge Planning: Goal: Identification of resources available to assist in meeting health care needs will improve Outcome: Progressing Goal: Compliance with treatment plan for underlying cause of condition will improve Outcome: Progressing   Problem: Safety: Goal: Periods of time without injury will increase Outcome: Progressing   Problem: Education: Goal: Utilization of techniques to improve thought processes will improve Outcome: Progressing Goal: Knowledge of the prescribed therapeutic regimen will improve Outcome: Progressing   Problem: Activity: Goal: Interest or engagement in leisure activities will improve Outcome: Progressing Goal: Imbalance in normal sleep/wake cycle will improve Outcome: Progressing   Problem: Coping: Goal: Coping ability will improve Outcome: Progressing Goal: Will verbalize feelings Outcome: Progressing   Problem: Self-Concept: Goal: Will verbalize positive feelings about self Outcome: Progressing Goal: Level of anxiety will decrease Outcome: Progressing   Problem: Medication: Goal: Compliance with prescribed medication regimen will improve Outcome:  Progressing   Problem: Self-Concept: Goal: Ability to disclose and discuss suicidal ideas will improve Outcome: Progressing Goal: Will verbalize positive feelings about self Outcome: Progressing   Problem: Coping: Goal: Ability to identify and develop effective coping behavior will improve Outcome: Progressing Goal: Ability to interact with others will improve Outcome: Progressing Goal: Demonstration of participation in decision-making regarding own care will improve Outcome: Progressing Goal: Ability to use eye contact when communicating with others will improve Outcome: Progressing

## 2018-12-13 NOTE — Progress Notes (Signed)
Recreation Therapy Notes   Date: 12/13/2018  Time: 9:30 am  Location: Craft Room  Behavioral response: Appropriate  Intervention Topic: Leisure  Discussion/Intervention:  Group content today was focused on leisure. The group defined what leisure is and some positive leisure activities they participate in. Individuals identified the difference between good and bad leisure. Participants expressed how they feel after participating in the leisure of their choice. The group discussed how they go about picking a leisure activity and if others are involved in their leisure activities. The patient stated how many leisure activities they too choose from and reasons why it is important to have leisure time. Individuals participated in the intervention "Leisure Jeopardy" where they had a chance to identify new leisure activities as well as benefits of leisure. Clinical Observations/Feedback:  Patient came to group and defined leisure as releasing negativity. He identified boating and traveling as ways he participates in leisure. Participant explained that he plans days off work to participate in leisure. Individual was social with peers and staff while participating in the intervention.  Festus Pursel LRT/CTRS         Othel Dicostanzo 12/13/2018 11:44 AM

## 2018-12-13 NOTE — BHH Suicide Risk Assessment (Signed)
BHH INPATIENT:  Family/Significant Other Suicide Prevention Education  Suicide Prevention Education:  Education Completed; Malva LimesMark Sames, father [phone] has been identified by the patient as the family member/significant other with whom the patient will be residing, and identified as the person(s) who will aid the patient in the event of a mental health crisis (suicidal ideations/suicide attempt).  With written consent from the patient, the family member/significant other has been provided the following suicide prevention education, prior to the and/or following the discharge of the patient.  The suicide prevention education provided includes the following:  Suicide risk factors  Suicide prevention and interventions  National Suicide Hotline telephone number  [facility] assessment telephone number  [facility] Emergency Assistance 911  [facility] and/or Residential Mobile Crisis Unit telephone number  Request made of family/significant other to:  Remove weapons (e.g., guns, rifles, knives), all items previously/currently identified as safety concern.    Remove drugs/medications (over-the-counter, prescriptions, illicit drugs), all items previously/currently identified as a safety concern.  The family member/significant other verbalizes understanding of the suicide prevention education information provided.  The family member/significant other agrees to remove the items of safety concern listed above. [name] reports he is unsure if he will allow pt to return to his home. He states he would like to speak with the pt's physician about medication he will be prescribed at discharge. He reports pt has no access to guns or weapons in the home. CSW provided Mr. Polhamus with physician number. CSW encouraged him to call 911 or bring pt to the nearest emergency department if he needs further medical attention.  Quanda Pavlicek T Graysin Luczynski 12/13/2018, 1:32 PM

## 2018-12-13 NOTE — Progress Notes (Signed)
[facility] Note  12/13/2018 1:53 PM Tom MarchKaleb A Branch  MRN:  [id number]  Subjective:   Tom Branch denies any symptoms of depression, anxiety or psychosis. Tolerates medications well and is asking for discharge. I received a phone call from his father to inform me that the patient will not be allowed to return home if he is not on Concerta, that the patient has been taking "all his life". This is non negotiable. Concerta has been lately prescribed by Dr. Marcello FennelHande his PCP who recently attempted to transfer [facility].  Given diagnosis of bipolar disorder, it does not seem like a good idea. Under pressure, I will restart Concerta 36 mg daily and keep the patient in the hospital over the weekend.   Principal Problem: Bipolar I disorder, most recent episode depressed, severe without psychotic features (HCC) Diagnosis: Principal Problem:   Bipolar I disorder, most recent episode depressed, severe without psychotic features (HCC) Active Problems:   Self-inflicted laceration of wrist   PTSD (post-traumatic stress disorder)   OCD (obsessive compulsive disorder)   Attention deficit hyperactivity disorder (ADHD)  Total Time spent with patient: 20 minutes  Past Psychiatric History: bipolar, PTSD, OCD, ADHD  Past Medical History:  Past Medical History:  Diagnosis Date  . ADHD   . Adopted    no family medical hx  . Asthma   . Kidney stones   . Wears contact lenses     Past Surgical History:  Procedure Laterality Date  . LITHOTRIPSY    . TONSILLECTOMY N/A 12/13/2016   Procedure: TONSILLECTOMY;  Surgeon: Bud Facereighton Vaught, MD;  [facility];  Service: ENT;  Laterality: N/A;  . ULNAR NERVE TRANSPOSITION Left 10/09/2018   Procedure: SUBCUTANEOUS TRANSPOSITION OF THE ULNAR NERVE OF LEFT ELBOW;  Surgeon: Christena FlakePoggi, John J, MD;  [facility];  Service: Orthopedics;  Laterality: Left;  . WISDOM TOOTH EXTRACTION     Family History: History  reviewed. No pertinent family history. Family Psychiatric  History: unknown, patient adopted Social History:  Social History   Substance and Sexual Activity  Alcohol Use No     Social History   Substance and Sexual Activity  Drug Use No    Social History   Socioeconomic History  . Marital status: Single    Spouse name: Not on file  . Number of children: Not on file  . Years of education: Not on file  . Highest education level: Not on file  Occupational History  . Not on file  Social Needs  . Financial resource strain: Not on file  . Food insecurity:    Worry: Not on file    Inability: Not on file  . Transportation needs:    Medical: Not on file    Non-medical: Not on file  Tobacco Use  . Smoking status: Never Smoker  . Smokeless tobacco: Never Used  Substance and Sexual Activity  . Alcohol use: No  . Drug use: No  . Sexual activity: Not on file  Lifestyle  . Physical activity:    Days per week: Not on file    Minutes per session: Not on file  . Stress: Not on file  Relationships  . Social connections:    Talks on phone: Not on file    Gets together: Not on file    Attends religious service: Not on file    Active member of club or organization: Not on file    Attends meetings of clubs or organizations: Not  on file    Relationship status: Not on file  Other Topics Concern  . Not on file  Social History Narrative  . Not on file   Additional Social History:  Specify valuables returned: clothing                      Sleep: Fair  Appetite:  Fair  Current Medications: Current Facility-Administered Medications  Medication Dose Route Frequency Provider Last Rate Last Dose  . acetaminophen (TYLENOL) tablet 650 mg  650 mg Oral Q6H PRN Clapacs, John T, MD      . alum & mag hydroxide-simeth (MAALOX/MYLANTA) 200-200-20 MG/5ML suspension 30 mL  30 mL Oral Q4H PRN Clapacs, John T, MD      . ARIPiprazole (ABILIFY) tablet 10 mg  10 mg Oral Daily Clapacs,  Jackquline Denmark, MD   10 mg at 12/13/18 0915  . fluvoxaMINE (LUVOX) tablet 100 mg  100 mg Oral QHS Makinzey Banes B, MD      . hydrOXYzine (ATARAX/VISTARIL) tablet 50 mg  50 mg Oral TID PRN Clapacs, Jackquline Denmark, MD   50 mg at 12/12/18 2132  . magnesium hydroxide (MILK OF MAGNESIA) suspension 30 mL  30 mL Oral Daily PRN Clapacs, Jackquline Denmark, MD      . Melene Muller ON 12/14/2018] methylphenidate (CONCERTA) CR tablet 36 mg  36 mg Oral Daily Makesha Belitz B, MD      . prazosin (MINIPRESS) capsule 2 mg  2 mg Oral BID Shadrack Brummitt B, MD   2 mg at 12/13/18 0915    Lab Results: No results found for this or any previous visit (from the past 48 hour(s)).  Blood Alcohol level:  Lab Results  Component Value Date   ETH <10 12/09/2018    Metabolic Disorder Labs: Lab Results  Component Value Date   HGBA1C 4.8 12/11/2018   MPG 91.06 12/11/2018   No results found for: PROLACTIN Lab Results  Component Value Date   CHOL 172 12/11/2018   TRIG 137 12/11/2018   HDL 33 (L) 12/11/2018   CHOLHDL 5.2 12/11/2018   VLDL 27 12/11/2018   LDLCALC 112 (H) 12/11/2018    Physical Findings: AIMS: Facial and Oral Movements Muscles of Facial Expression: None, normal Lips and Perioral Area: None, normal Jaw: None, normal Tongue: None, normal,Extremity Movements Upper (arms, wrists, hands, fingers): None, normal Lower (legs, knees, ankles, toes): None, normal, Trunk Movements Neck, shoulders, hips: None, normal, Overall Severity Severity of abnormal movements (highest score from questions above): None, normal Incapacitation due to abnormal movements: None, normal Patient's awareness of abnormal movements (rate only patient's report): No Awareness, Dental Status Current problems with teeth and/or dentures?: No Does patient usually wear dentures?: No  CIWA:    COWS:     Musculoskeletal: Strength & Muscle Tone: within normal limits Gait & Station: normal Patient leans: N/A  Psychiatric Specialty  Exam: Physical Exam  Nursing note and vitals reviewed. Psychiatric: He has a normal mood and affect. His speech is normal and behavior is normal. Thought content normal. Cognition and memory are normal. He expresses impulsivity.    Review of Systems  Neurological: Negative.   Psychiatric/Behavioral: Negative.   All other systems reviewed and are negative.   Blood pressure (!) 135/96, pulse 73, temperature 97.8 F (36.6 C), temperature source Oral, resp. rate 16, height 5' 6.5" (1.689 m), weight 83.9 kg, SpO2 100 %.Body mass index is 29.41 kg/m.  General Appearance: Casual  Eye Contact:  Good  Speech:  Clear  and Coherent  Volume:  Normal  Mood:  Euthymic  Affect:  Appropriate  Thought Process:  Goal Directed and Descriptions of Associations: Intact  Orientation:  Full (Time, Place, and Person)  Thought Content:  WDL  Suicidal Thoughts:  No  Homicidal Thoughts:  No  Memory:  Immediate;   Fair Recent;   Fair Remote;   Fair  Judgement:  Impaired  Insight:  Shallow  Psychomotor Activity:  Normal  Concentration:  Concentration: Fair and Attention Span: Fair  Recall:  [employer]Fair  Fund of Knowledge:  Fair  Language:  Fair  Akathisia:  No  Handed:  Right  AIMS (if indicated):     Assets:  Communication Skills Desire for Improvement Financial Resources/Insurance Intimacy Physical Health Resilience Transportation Vocational/Educational  ADL's:  Intact  Cognition:  WNL  Sleep:  Number of Hours: 7.45     Treatment Plan Summary: Daily contact with patient to assess and evaluate symptoms and progress in treatment and Medication management   Tom Branch is a 22 year old male with a history of depression and PTSD admitted after suicide attempt by superficial cutting his wrist with s steak knife. He accepted medication adjustments and tolerated them well. He was hoping to be discharge today but his family firmly opposes. He is not allowed to return home if not on Concerta. They also  think that he is dishonest with me.   #Depression, improved -contiue Abilify 10 mg daily -Luvox 100 mg nightly -Lunesta 2 mg PRN insomnia  #PTSD -continue Minipress to 2 mg BIDfor nightmares and flashbacks  #ADHD -start Concerta 32 mg daily  #Labs -lipid panel, TSH, A1C are normal -EKGreviewed, NSR with QTc 383  #Disposition  -TBE -follow up with [facility] for therapy and medication management   Kristine LineaJolanta Harper Smoker, MD 12/13/2018, 1:53 PM

## 2018-12-13 NOTE — Plan of Care (Signed)
Pt is A  o x3.  Visible in the milieu. Cooperative and pleasant. Pt is med compliant. Q5815min safety checks maintained.  Problem: Education: Goal: Knowledge of [facility] information/materials will improve Outcome: Progressing Goal: Emotional status will improve Outcome: Progressing Goal: Mental status will improve Outcome: Progressing Goal: Verbalization of understanding the information provided will improve Outcome: Progressing   Problem: Health Behavior/Discharge Planning: Goal: Identification of resources available to assist in meeting health care needs will improve Outcome: Progressing Goal: Compliance with treatment plan for underlying cause of condition will improve Outcome: Progressing   Problem: Safety: Goal: Periods of time without injury will increase Outcome: Progressing   Problem: Education: Goal: Utilization of techniques to improve thought processes will improve Outcome: Progressing Goal: Knowledge of the prescribed therapeutic regimen will improve Outcome: Progressing   Problem: Activity: Goal: Interest or engagement in leisure activities will improve Outcome: Progressing Goal: Imbalance in normal sleep/wake cycle will improve Outcome: Progressing   Problem: Coping: Goal: Coping ability will improve Outcome: Progressing Goal: Will verbalize feelings Outcome: Progressing   Problem: Self-Concept: Goal: Will verbalize positive feelings about self Outcome: Progressing Goal: Level of anxiety will decrease Outcome: Progressing   Problem: Medication: Goal: Compliance with prescribed medication regimen will improve Outcome: Progressing   Problem: Self-Concept: Goal: Ability to disclose and discuss suicidal ideas will improve Outcome: Progressing Goal: Will verbalize positive feelings about self Outcome: Progressing   Problem: Coping: Goal: Ability to identify and develop effective coping behavior will improve Outcome:  Progressing Goal: Ability to interact with others will improve Outcome: Progressing Goal: Demonstration of participation in decision-making regarding own care will improve Outcome: Progressing Goal: Ability to use eye contact when communicating with others will improve Outcome: Progressing

## 2018-12-13 NOTE — Discharge Summary (Addendum)
Physician Discharge Summary Note  Patient:  Tom Branch is an 22 y.o., male MRN:  [id number] DOB:  1996/04/08 Patient phone:  [phone] (home)  Patient address:   [address] [place] [address],  Total Time spent with patient: 20 minutes plus 15 min on care coordination and documentation.  Date of Admission:  12/10/2018 Date of Discharge: 12/13/2018  Reason for Admission:  Suicide attempt.  History of Present Illness:   Identifying data. Tom Branch is a 22 year old male with a history of depression and PTSD.  Chief complaint. "This was the bracelet."  History of present illness. Information was obtained from the patient and the chart. The patient was brought to the hospital by his mother who caught the patient superficially cutting his skin at the elbow. She initially hid all the knifes but the patient round another one to continue his job. The patient believes that this attempt was triggered by memory of his friend and fellow soldier who committed suicide during training four months ago. He blew himself with a grenade causing multiple injuries to people around him including the patient. Reportedly pieces of his bone became inserted on the patient's elbow area requiring surgery. The suicide victim wrote a note indicating that he wanted the patient to keep his bracelet in the case of death. Indeed, the bracelet was removed from the severed arm of the victim and given to the patient. On the day of attempt, he put it on, had a flashback and decided to end hid life.  He does not list any additional stressors. He is in a relationship and happy, likes his job at [employer], has supportive family. For the past two months, he has been seeing Dr. Carman Ching at [facility] who prescribes Abilify 10 mg daily with improvement in depressive symptoms. He also sees a therapist there. He denies any problems with substances. UDS is negative.   He is very pleasant and  engaging during the interview. He does not strike me as depressed at all. Smiles al the time, is restless, hyper. Speech is loud and pressured. He denies any problems except insomnia which he believes is from Abilify which he takes in the morning. He claims to be always hyper.  Past psychiatric history. 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 He attempted suicide recently by overdose on Tylenol and was admitted to [facility], given diagnosis of bipolar disorder and started on Abilify. Since childhood, per parents report, he has been on Concerta for ADHD.  Family psychiatric history. Unknown, patient is adopted.  Social history. He comes from a military family with his both grandfathers and the mother serving during conflicts. Military career was his goal. He has a degree. 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 Has private insurance.   Principal Problem: Bipolar I disorder, most recent episode depressed, severe without psychotic features [facility]) Discharge Diagnoses: Principal Problem:   Bipolar I disorder, most recent episode depressed, severe without psychotic features (HCC) Active Problems:   Self-inflicted laceration of wrist   PTSD (post-traumatic stress disorder)   OCD (obsessive compulsive disorder)   Attention deficit hyperactivity disorder (ADHD)   Severe recurrent major depression without psychotic features [facility])  Past Medical History:  Past Medical History:  Diagnosis Date  . ADHD   . Adopted    no family medical hx  . Asthma   .  Kidney stones   . Wears contact lenses     Past Surgical History:  Procedure Laterality Date  . LITHOTRIPSY    . TONSILLECTOMY N/A 12/13/2016   Procedure: TONSILLECTOMY;  Surgeon: Bud Facereighton Vaught, MD;  [facility];  Service: ENT;  Laterality: N/A;  . ULNAR NERVE TRANSPOSITION Left  10/09/2018   Procedure: SUBCUTANEOUS TRANSPOSITION OF THE ULNAR NERVE OF LEFT ELBOW;  Surgeon: Christena FlakePoggi, John J, MD;  [facility];  Service: Orthopedics;  Laterality: Left;  . WISDOM TOOTH EXTRACTION     Family History: History reviewed. No pertinent family history.  Social History:  Social History   Substance and Sexual Activity  Alcohol Use No     Social History   Substance and Sexual Activity  Drug Use No    Social History   Socioeconomic History  . Marital status: Single    Spouse name: Not on file  . Number of children: Not on file  . Years of education: Not on file  . Highest education level: Not on file  Occupational History  . Not on file  Social Needs  . Financial resource strain: Not on file  . Food insecurity:    Worry: Not on file    Inability: Not on file  . Transportation needs:    Medical: Not on file    Non-medical: Not on file  Tobacco Use  . Smoking status: Never Smoker  . Smokeless tobacco: Never Used  Substance and Sexual Activity  . Alcohol use: No  . Drug use: No  . Sexual activity: Not on file  Lifestyle  . Physical activity:    Days per week: Not on file    Minutes per session: Not on file  . Stress: Not on file  Relationships  . Social connections:    Talks on phone: Not on file    Gets together: Not on file    Attends religious service: Not on file    Active member of club or organization: Not on file    Attends meetings of clubs or organizations: Not on file    Relationship status: Not on file  Other Topics Concern  . Not on file  Social History Narrative  . Not on file    Hospital Course:    Tom Branch is a 22 year old male with a history of depression and PTSD admitted after suicide attempt by superficial cutting his wrist with s steak knife. He accepted medication adjustments and tolerated them well. At the time of discharge, the patient is no longer suicidal or homicidal. He is able to contract for safety.  He is forward thinking and optimistic about the future.  #Depression, improved -contiue Abilify 10 mg daily -Luvox 100 mg nightly -Lunesta 2 mg PRN insomnia  #PTSD -continue Minipress to 2 mg BIDfor nightmares and flashbacks  #ADHD -Concerta 36 mg daily -family adamant about keeping Concerta  #Labs -lipid panel, TSH, A1C are normal -EKGreviewed, NSR with QTc 383  #Disposition  -discharge with family -follow up with [facility] for therapy and medication management   Physical Findings: AIMS: Facial and Oral Movements Muscles of Facial Expression: None, normal Lips and Perioral Area: None, normal Jaw: None, normal Tongue: None, normal,Extremity Movements Upper (arms, wrists, hands, fingers): None, normal Lower (legs, knees, ankles, toes): None, normal, Trunk Movements Neck, shoulders, hips: None, normal, Overall Severity Severity of abnormal movements (highest score from questions above): None, normal Incapacitation due to abnormal movements: None, normal Patient's awareness of abnormal movements (rate only  patient's report): No Awareness, Dental Status Current problems with teeth and/or dentures?: No Does patient usually wear dentures?: No  CIWA:    COWS:     Musculoskeletal: Strength & Muscle Tone: within normal limits Gait & Station: normal Patient leans: N/A  Psychiatric Specialty Exam: Physical Exam  Nursing note and vitals reviewed. Psychiatric: He has a normal mood and affect. His speech is normal and behavior is normal. Thought content normal. Cognition and memory are normal. He expresses impulsivity.    Review of Systems  Neurological: Negative.   Psychiatric/Behavioral: Negative.   All other systems reviewed and are negative.   Blood pressure 117/69, pulse 65, temperature 99 F (37.2 C), temperature source Oral, resp. rate 16, height 5' 6.5" (1.689 m), weight 83.9 kg, SpO2 99 %.Body mass index is 29.41 kg/m.  General Appearance: Casual  Eye Contact:   Good  Speech:  Clear and Coherent  Volume:  Normal  Mood:  Euthymic  Affect:  Appropriate  Thought Process:  Goal Directed and Descriptions of Associations: Intact  Orientation:  Full (Time, Place, and Person)  Thought Content:  WDL  Suicidal Thoughts:  No  Homicidal Thoughts:  No  Memory:  Immediate;   Fair Recent;   Fair Remote;   Fair  Judgement:  Impaired  Insight:  Shallow  Psychomotor Activity:  Normal  Concentration:  Concentration: Fair and Attention Span: Fair  Recall:  [employer] of Knowledge:  Fair  Language:  Fair  Akathisia:  No  Handed:  Right  AIMS (if indicated):     Assets:  Communication Skills Desire for Improvement Financial Resources/Insurance Housing Intimacy Physical Health Resilience Social Support Transportation Vocational/Educational  ADL's:  Intact  Cognition:  WNL  Sleep:  Number of Hours: 7.15     Have you used any form of tobacco in the last 30 days? (Cigarettes, Smokeless Tobacco, Cigars, and/or Pipes): No  Has this patient used any form of tobacco in the last 30 days? (Cigarettes, Smokeless Tobacco, Cigars, and/or Pipes) Yes, No  Blood Alcohol level:  Lab Results  Component Value Date   ETH <10 12/09/2018    Metabolic Disorder Labs:  Lab Results  Component Value Date   HGBA1C 4.8 12/11/2018   MPG 91.06 12/11/2018   No results found for: PROLACTIN Lab Results  Component Value Date   CHOL 172 12/11/2018   TRIG 137 12/11/2018   HDL 33 (L) 12/11/2018   CHOLHDL 5.2 12/11/2018   VLDL 27 12/11/2018   LDLCALC 112 (H) 12/11/2018    See Psychiatric Specialty Exam and Suicide Risk Assessment completed by Attending Physician prior to discharge.  Discharge destination:  Home  Is patient on multiple antipsychotic therapies at discharge:  No   Has Patient had three or more failed trials of antipsychotic monotherapy by history:  No  Recommended Plan for Multiple Antipsychotic Therapies: NA  Discharge Instructions    Diet -  low sodium heart healthy   Complete by:  As directed    Diet - low sodium heart healthy   Complete by:  As directed    Increase activity slowly   Complete by:  As directed    Increase activity slowly   Complete by:  As directed      Allergies as of 12/16/2018      Reactions   Dilaudid [hydromorphone Hcl] Hives   Latex Hives   Gloves (when worn)   Lactose Intolerance (gi)    Meat Extract Hives   Red meat   Other  Bleach, Neosporin: Rash      Medication List    STOP taking these medications   esomeprazole 20 MG capsule Commonly known as:  NEXIUM   ibuprofen 200 MG tablet Commonly known as:  ADVIL,MOTRIN   naproxen sodium 220 MG tablet Commonly known as:  ALEVE   oxyCODONE 5 MG immediate release tablet Commonly known as:  ROXICODONE     TAKE these medications     Indication  albuterol 108 (90 Base) MCG/ACT inhaler Commonly known as:  PROVENTIL HFA;VENTOLIN HFA Inhale into the lungs every 6 (six) hours as needed for wheezing or shortness of breath.  Indication:  Asthma   ARIPiprazole 10 MG tablet Commonly known as:  ABILIFY Take 1 tablet (10 mg total) by mouth daily.  Indication:  MIXED BIPOLAR AFFECTIVE DISORDER   EPINEPHrine 0.3 mg/0.3 mL Soaj injection Commonly known as:  EPIPEN 2-PAK Inject 0.3 mLs (0.3 mg total) into the muscle once.  Indication:  Life-Threatening Hypersensitivity Reaction   eszopiclone 2 MG Tabs tablet Commonly known as:  LUNESTA Take 1 tablet (2 mg total) by mouth at bedtime. Take immediately before bedtime  Indication:  Trouble Sleeping   FISH OIL PO Take by mouth daily.  Indication:  general health   fluvoxaMINE 100 MG tablet Commonly known as:  LUVOX Take 1 tablet (100 mg total) by mouth at bedtime.  Indication:  Obsessive Compulsive Disorder, Posttraumatic Stress Disorder   loratadine 10 MG tablet Commonly known as:  CLARITIN Take 1 tablet by mouth as needed.  Indication:  Hayfever   Melatonin 3 MG Tabs Take 12 mg by  mouth at bedtime as needed.  Indication:  Trouble Sleeping   methylphenidate 36 MG CR tablet Commonly known as:  CONCERTA Take 72 mg by mouth daily. What changed:  Another medication with the same name was added. Make sure you understand how and when to take each.  Indication:  Attention Deficit Hyperactivity Disorder   methylphenidate 36 MG CR tablet Commonly known as:  CONCERTA Take 1 tablet (36 mg total) by mouth daily. Start taking on:  December 17, 2018 What changed:  You were already taking a medication with the same name, and this prescription was added. Make sure you understand how and when to take each.  Indication:  Attention Deficit Hyperactivity Disorder   MULTI-VITAMINS Tabs Take 1 tablet by mouth daily.  Indication:  general health   prazosin 2 MG capsule Commonly known as:  MINIPRESS Take 1 capsule (2 mg total) by mouth 2 (two) times daily.  Indication:  PTSD      Follow-up Information    [facility] Go on 12/19/2018.   Why:  Please go to [facility] on Thursday, December 26th at 930am for a hospital follow up appointment. You are scheduled to meet with Dr. Marguerite OleaMoffett on Monday, December 23, 2018 at 1240pm. Thank you. Contact information: [address] [place] [address] [phone]           Follow-up recommendations:  Activity:  as tolerated Diet:  regular Other:  keep follow up appointments  Comments:     Signed: Kristine LineaJolanta Pucilowska, MD 12/16/2018, 9:20 AM

## 2018-12-14 DIAGNOSIS — F4310 Post-traumatic stress disorder, unspecified: Secondary | ICD-10-CM

## 2018-12-14 DIAGNOSIS — F314 Bipolar disorder, current episode depressed, severe, without psychotic features: Principal | ICD-10-CM

## 2018-12-14 DIAGNOSIS — F902 Attention-deficit hyperactivity disorder, combined type: Secondary | ICD-10-CM

## 2018-12-14 NOTE — Plan of Care (Signed)
Patient is doing well in the unit, treatment is working well, socializing with peers with out any issues compliant and unite procedure with direction and encouragement, denies any SI,    Patient is making positive statements regarding self and the ability to cope with the stress of life , mood is normal with no signs of depression or mood elevations . Patient affect is congruent with mood, no indication of psychotic  process. Association is intact , thinking is logical and content is appropriate, patient contract for safety denies any SI,HI AVH , maintaining safety in the unit and 15 minute safety rounding is in progress.   Problem: Education: Goal: Knowledge of [place] [facility] Education information/materials will improve Outcome: Progressing Goal: Emotional status will improve Outcome: Progressing Goal: Mental status will improve Outcome: Progressing Goal: Verbalization of understanding the information provided will improve Outcome: Progressing   Problem: Health Behavior/Discharge Planning: Goal: Identification of resources available to assist in meeting health care needs will improve Outcome: Progressing Goal: Compliance with treatment plan for underlying cause of condition will improve Outcome: Progressing   Problem: Safety: Goal: Periods of time without injury will increase Outcome: Progressing   Problem: Education: Goal: Utilization of techniques to improve thought processes will improve Outcome: Progressing Goal: Knowledge of the prescribed therapeutic regimen will improve Outcome: Progressing   Problem: Activity: Goal: Interest or engagement in leisure activities will improve Outcome: Progressing Goal: Imbalance in normal sleep/wake cycle will improve Outcome: Progressing   Problem: Coping: Goal: Coping ability will improve Outcome: Progressing Goal: Will verbalize feelings Outcome: Progressing   Problem: Self-Concept: Goal: Will verbalize positive feelings  about self Outcome: Progressing Goal: Level of anxiety will decrease Outcome: Progressing   Problem: Medication: Goal: Compliance with prescribed medication regimen will improve Outcome: Progressing   Problem: Self-Concept: Goal: Ability to disclose and discuss suicidal ideas will improve Outcome: Progressing Goal: Will verbalize positive feelings about self Outcome: Progressing   Problem: Coping: Goal: Ability to identify and develop effective coping behavior will improve Outcome: Progressing Goal: Ability to interact with others will improve Outcome: Progressing Goal: Demonstration of participation in decision-making regarding own care will improve Outcome: Progressing Goal: Ability to use eye contact when communicating with others will improve Outcome: Progressing

## 2018-12-14 NOTE — Progress Notes (Signed)
Tom Branch  MRN:  [id number]  Subjective:    The patient reports that he is doing "well" and denies any current significant anxiety or depressive symptoms.  He denies any current active or passive suicidal thoughts or psychosis.  He says he has to stay in the hospital because his family is worried about how his Concerta will interact with other psychotropic medications.  He denies any urges to cut and says he cut secondary to a trigger that led to a flashback regarding prior trauma from [employer] and Compan[employer] experience.  The patient does not have [place] benefits [facility].  [facility].  He is hoping to get [facility].  Patient denies any new somatic complaints or adverse side effects associated with current psychotropic medications.  Vital signs are stable.  He slept over 8 hours last night and appetite is good.  He has been attending groups on the unit and says that they were going well.  He is trying to learn coping skills to decrease urges to cut and distraction techniques for distress tolerance.    Past psychiatric history. 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 He attempted suicide recently by overdose on Tylenol and [facility], given diagnosis of bipolar disorder and started on Abilify.  Family psychiatric history. Unknown, patient is adopted.  Social history. He comes from a military family with his both grandfathers and the mother serving during conflicts. Military career was his goal. Has private insurance.   Principal Problem: Bipolar I disorder, most recent episode depressed, severe without psychotic features (HCC) Diagnosis: Principal Problem:   Bipolar I disorder, most recent episode depressed, severe without psychotic features  (HCC) Active Problems:   Self-inflicted laceration of wrist   PTSD (post-traumatic stress disorder)   OCD (obsessive compulsive disorder)   Attention deficit hyperactivity disorder (ADHD)   Severe recurrent major depression without psychotic features (HCC)  Total Time spent with patient:  20 minutes   Past Medical History:  Past Medical History:  Diagnosis Date  . ADHD   . Adopted    no family medical hx  . Asthma   . Kidney stones   . Wears contact lenses     Past Surgical History:  Procedure Laterality Date  . LITHOTRIPSY    . TONSILLECTOMY N/A 12/13/2016   Procedure: TONSILLECTOMY;  Surgeon: Bud Facereighton Vaught, MD;  [facility];  Service: ENT;  Laterality: N/A;  . ULNAR NERVE TRANSPOSITION Left 10/09/2018   Procedure: SUBCUTANEOUS TRANSPOSITION OF THE ULNAR NERVE OF LEFT ELBOW;  Surgeon: Christena FlakePoggi, John J, MD;  [facility];  Service: Orthopedics;  Laterality: Left;  . WISDOM TOOTH EXTRACTION      Social History:  Social History   Substance and Sexual Activity  Alcohol Use No     Social History   Substance and Sexual Activity  Drug Use No    Social History   Socioeconomic History  . Marital status: Single    Spouse name: Not on file  . Number of children: Not on file  . Years of education: Not on file  . Highest education level: Not on file  Occupational History  . Not on file  Social Needs  . Financial resource strain:  Not on file  . Food insecurity:    Worry: Not on file    Inability: Not on file  . Transportation needs:    Medical: Not on file    Non-medical: Not on file  Tobacco Use  . Smoking status: Never Smoker  . Smokeless tobacco: Never Used  Substance and Sexual Activity  . Alcohol use: No  . Drug use: No  . Sexual activity: Not on file  Lifestyle  . Physical activity:    Days per week: Not on file    Minutes per session: Not on file  . Stress: Not on file  Relationships  . Social connections:    Talks  on phone: Not on file    Gets together: Not on file    Attends religious service: Not on file    Active member of club or organization: Not on file    Attends meetings of clubs or organizations: Not on file    Relationship status: Not on file  Other Topics Concern  . Not on file  Social History Narrative  . Not on file   Additional Social History:  Specify valuables returned: clothing          Sleep: Good  Appetite:  Good  Current Medications: Current Facility-Administered Medications  Medication Dose Route Frequency Provider Last Rate Last Dose  . acetaminophen (TYLENOL) tablet 650 mg  650 mg Oral Q6H PRN Clapacs, John T, MD      . alum & mag hydroxide-simeth (MAALOX/MYLANTA) 200-200-20 MG/5ML suspension 30 mL  30 mL Oral Q4H PRN Clapacs, John T, MD      . ARIPiprazole (ABILIFY) tablet 10 mg  10 mg Oral Daily Clapacs, Jackquline Denmark, MD   10 mg at 12/14/18 1610  . fluvoxaMINE (LUVOX) tablet 100 mg  100 mg Oral QHS Pucilowska, Jolanta B, MD   100 mg at 12/13/18 2122  . hydrOXYzine (ATARAX/VISTARIL) tablet 50 mg  50 mg Oral TID PRN Clapacs, Jackquline Denmark, MD   50 mg at 12/13/18 2122  . magnesium hydroxide (MILK OF MAGNESIA) suspension 30 mL  30 mL Oral Daily PRN Clapacs, John T, MD      . methylphenidate (CONCERTA) CR tablet 36 mg  36 mg Oral Daily Pucilowska, Jolanta B, MD   36 mg at 12/14/18 0812  . prazosin (MINIPRESS) capsule 2 mg  2 mg Oral BID Pucilowska, Jolanta B, MD   2 mg at 12/14/18 9604    Lab Results: No results found for this or any previous visit (from the past 48 hour(s)).  Blood Alcohol level:  Lab Results  Component Value Date   ETH <10 12/09/2018    Metabolic Disorder Labs: Lab Results  Component Value Date   HGBA1C 4.8 12/11/2018   MPG 91.06 12/11/2018   No results found for: PROLACTIN Lab Results  Component Value Date   CHOL 172 12/11/2018   TRIG 137 12/11/2018   HDL 33 (L) 12/11/2018   CHOLHDL 5.2 12/11/2018   VLDL 27 12/11/2018   LDLCALC 112 (H)  12/11/2018    Physical Findings: AIMS: Facial and Oral Movements Muscles of Facial Expression: None, normal Lips and Perioral Area: None, normal Jaw: None, normal Tongue: None, normal,Extremity Movements Upper (arms, wrists, hands, fingers): None, normal Lower (legs, knees, ankles, toes): None, normal, Trunk Movements Neck, shoulders, hips: None, normal, Overall Severity Severity of abnormal movements (highest score from questions above): None, normal Incapacitation due to abnormal movements: None, normal Patient's awareness of abnormal movements (rate only patient's report):  No Awareness, Dental Status Current problems with teeth and/or dentures?: No Does patient usually wear dentures?: No  CIWA:    COWS:     Musculoskeletal: Strength & Muscle Tone: WNL Gait & Station: normal Patient leans: N/A  Psychiatric Specialty Exam: Physical Exam  Nursing note and vitals reviewed. Psychiatric: He has a normal mood and affect. His speech is normal and behavior is normal. Thought content normal. Cognition and memory are normal. He expresses impulsivity.    Review of Systems  Constitutional: Negative.   HENT: Negative.   Eyes: Negative.   Respiratory: Negative.   Cardiovascular: Negative.   Gastrointestinal: Negative.   Musculoskeletal: Negative.   Skin: Negative.   Neurological: Negative.   Endo/Heme/Allergies: Negative.     Blood pressure 121/65, pulse 75, temperature 98.3 F (36.8 C), temperature source Oral, resp. rate 16, height 5' 6.5" (1.689 m), weight 83.9 kg, SpO2 99 %.Body mass index is 29.41 kg/m.  General Appearance: Casual  Eye Contact:  Good  Speech:  Clear and Coherent  Volume:  Normal  Mood:  Euthymic  Affect:  Appropriate  Thought Process:  Goal Directed and Descriptions of Associations: Intact  Orientation:  Full (Time, Place, and Person)  Thought Content:  WDL  Suicidal Thoughts:  No  Homicidal Thoughts:  No  Memory:  Immediate;   Fair Recent;    Fair Remote;   Fair  Judgement:  Impaired  Insight:  Shallow  Psychomotor Activity:  Normal  Concentration:  Concentration: Fair and Attention Span: Fair  Recall:  [employer]Fair  Fund of Knowledge:  Fair  Language:  Fair  Akathisia:  No  Handed:  Right  AIMS (if indicated):     Assets:  Communication Skills Desire for Improvement Financial Resources/Insurance Intimacy Physical Health Resilience Transportation Vocational/Educational  ADL's:  Intact  Cognition:  WNL  Sleep:  Number of Hours: 8.15     Treatment Plan Summary: Daily contact with patient to assess and evaluate symptoms and progress in treatment and Medication management   [name] is a 22 year old male with a history of depression and PTSD admitted after suicide attempt by superficial cutting his wrist with s steak knife. He accepted medication adjustments and tolerated them well. Dr Jennet MaduroPucilowska has spoken with his family who insist he stay in the hosptital to see how Concerta interacts with other meds.   #Depression, improved -Mood has improved and no suicidal thoughts -Continue Luvox 100 mg p.o. nightly -Continue Lunesta 2 mg p.o. as needed insomnia  ADHD -The patient was restarted on Concerta at 32 mg p.o. daily he has been on stimulants since childhood  PTSD -Continue Minipress 2 mg p.o. twice daily for nightmares and flashbacks  #Labs -lipid panel, TSH, A1C are normal -EKGreviewed, NSR with QTc 383  #Disposition  -The patient will return home to live with his family -Psychotropic medication management follow-up appointment will be with [facility]    Darliss RidgelAarti K Kapur, MD 12/14/2018, 12:08 PM

## 2018-12-14 NOTE — Plan of Care (Signed)
Patient is alert and oriented X 4. Patient is very pleasant, sociable and animated on the unit. Patient very focused on discharging soon. Patient informed on start of new medication concerta today and needing to monitor longer due to start of medication. Patient verbalized understanding. No self injurious behaviors noted. Patient denies SI, HI and AVH. Q 15 minutes safety checks to continue. Problem: Education: Goal: Knowledge of [place] [facility] Education information/materials will improve Outcome: Progressing Goal: Emotional status will improve Outcome: Progressing Goal: Mental status will improve Outcome: Progressing Goal: Verbalization of understanding the information provided will improve Outcome: Progressing   Problem: Health Behavior/Discharge Planning: Goal: Identification of resources available to assist in meeting health care needs will improve Outcome: Progressing Goal: Compliance with treatment plan for underlying cause of condition will improve Outcome: Progressing   Problem: Safety: Goal: Periods of time without injury will increase Outcome: Progressing   Problem: Education: Goal: Utilization of techniques to improve thought processes will improve Outcome: Progressing Goal: Knowledge of the prescribed therapeutic regimen will improve Outcome: Progressing   Problem: Activity: Goal: Interest or engagement in leisure activities will improve Outcome: Progressing Goal: Imbalance in normal sleep/wake cycle will improve Outcome: Progressing

## 2018-12-14 NOTE — [facility] Group Notes (Signed)
.  LCSW Group Therapy Note   12/14/2018 1:15pm   Type of Therapy and Topic:  Group Therapy:  Trust and Honesty  Participation Level:  Active  Description of Group:    In this group patients will be asked to explore the value of being honest.  Patients will be guided to discuss their thoughts, feelings, and behaviors related to honesty and trusting in others. Patients will process together how trust and honesty relate to forming relationships with peers, family members, and self. Each patient will be challenged to identify and express feelings of being vulnerable. Patients will discuss reasons why people are dishonest and identify alternative outcomes if one was truthful (to self or others). This group will be process-oriented, with patients participating in exploration of their own experiences, giving and receiving support, and processing challenge from other group members.   Therapeutic Goals: 1. Patient will identify why honesty is important to relationships and how honesty overall affects relationships.  2. Patient will identify a situation where they lied or were lied too and the  feelings, thought process, and behaviors surrounding the situation 3. Patient will identify the meaning of being vulnerable, how that feels, and how that correlates to being honest with self and others. 4. Patient will identify situations where they could have told the truth, but instead lied and explain reasons of dishonesty.   Summary of Patient Progress:The patient reported that he feels "good." The patient was able to explore the value of being honest.  Patient discussed thoughts, feelings, and behaviors related to honesty and trusting in others. The patient processed together with other group members how trust and honesty relate to forming relationships with peers, family members, and self. Pt actively and appropriately engaged in the group. Patient was able to provide support and validation to other group members.  Patient practiced active listening when interacting with the facilitator and other group members.    Therapeutic Modalities:   Cognitive Behavioral Therapy Solution Focused Therapy Motivational Interviewing Brief Therapy  Quatisha Zylka  CUEBAS-COLON, LCSW 12/14/2018 11:57 AM

## 2018-12-15 NOTE — Plan of Care (Signed)
  Problem: Safety: Goal: Periods of time without injury will increase Outcome: Progressing   Problem: Education: Goal: Knowledge of [place] [facility] Education information/materials will improve Outcome: Progressing Goal: Emotional status will improve Outcome: Progressing Goal: Mental status will improve Outcome: Progressing Goal: Verbalization of understanding the information provided will improve Outcome: Progressing   Problem: Health Behavior/Discharge Planning: Goal: Identification of resources available to assist in meeting health care needs will improve Outcome: Progressing Goal: Compliance with treatment plan for underlying cause of condition will improve Outcome: Progressing   Problem: Safety: Goal: Periods of time without injury will increase Outcome: Progressing   Problem: Education: Goal: Utilization of techniques to improve thought processes will improve Outcome: Progressing Goal: Knowledge of the prescribed therapeutic regimen will improve Outcome: Progressing   Problem: Activity: Goal: Interest or engagement in leisure activities will improve Outcome: Progressing Goal: Imbalance in normal sleep/wake cycle will improve Outcome: Progressing   Problem: Coping: Goal: Coping ability will improve Outcome: Progressing Goal: Will verbalize feelings Outcome: Progressing   Problem: Self-Concept: Goal: Will verbalize positive feelings about self Outcome: Progressing Goal: Level of anxiety will decrease Outcome: Progressing   Problem: Medication: Goal: Compliance with prescribed medication regimen will improve Outcome: Progressing   Problem: Self-Concept: Goal: Ability to disclose and discuss suicidal ideas will improve Outcome: Progressing Goal: Will verbalize positive feelings about self Interacting with peers and staff. Working on Arboriculturistcoping skills and decision making . Denies suicidal ideations . Interacting  with peers and staff  .   Voice no concerns  around wake and sleep . No safety concerns . Able to verbalize understanding of medication received . Continue to attend  unit programming. Verbalizing  understanding of information received  Outcome: Progressing   Problem: Coping: Goal: Ability to identify and develop effective coping behavior will improve Outcome: Progressing Goal: Ability to interact with others will improve Outcome: Progressing Goal: Demonstration of participation in decision-making regarding own care will improve Outcome: Progressing Goal: Ability to use eye contact when communicating with others will improve Outcome: Progressing

## 2018-12-15 NOTE — [facility] Group Notes (Signed)
LCSW Group Therapy Note 12/15/2018 1:15pm  Type of Therapy and Topic: Group Therapy: Feelings Around Returning Home & Establishing a Supportive Framework and Supporting Oneself When Supports Not Available  Participation Level: Active  Description of Group:  Patients first processed thoughts and feelings about upcoming discharge. These included fears of upcoming changes, lack of change, new living environments, judgements and expectations from others and overall stigma of mental health issues. The group then discussed the definition of a supportive framework, what that looks and feels like, and how do to discern it from an unhealthy non-supportive network. The group identified different types of supports as well as what to do when your family/friends are less than helpful or unavailable  Therapeutic Goals  1. Patient will identify one healthy supportive network that they can use at discharge. 2. Patient will identify one factor of a supportive framework and how to tell it from an unhealthy network. 3. Patient able to identify one coping skill to use when they do not have positive supports from others. 4. Patient will demonstrate ability to communicate their needs through discussion and/or role plays.  Summary of Patient Progress:  Patient stated he feels "a little down." Pt engaged during group session. As patients processed their anxiety about discharge and described healthy supports patient shared he is ready to be discharge. He listed his family and his girlfriend as his main support.  Patients identified at least one self-care tool they were willing to use after discharge.   Therapeutic Modalities Cognitive Behavioral Therapy Motivational Interviewing   Tom Branch  CUEBAS-COLON, LCSW 12/15/2018 11:15 AM

## 2018-12-15 NOTE — Progress Notes (Signed)
D: Patient stated slept good last night .Stated appetite good and energy level  normal. Stated concentration is good . Stated on Depression scale 0 , hopeless 0 and anxiety 0 .( low 0-10 high) Denies suicidal  homicidal ideations .  No auditory hallucinations  No pain concerns . Appropriate ADL'S. Interacting with peers and staff. Working on Arboriculturistcoping skills and decision making . Denies suicidal ideations . Interacting with peers and staff . Voice no concerns around wake and sleep. No safety concerns . Able to verbalize understanding of medication received . Continue to attend unit programming. Verbalizing understanding of information received  Voice of goal " Stay Positive" Stated he is working on  Coping and deep breathing  A: Encourage patient participation with unit programming . Instruction  Given on  Medication , verbalize understanding.  R: Voice no other concerns. Staff continue to monitor

## 2018-12-15 NOTE — Progress Notes (Signed)
[facility] Note  12/15/2018 1:02 PM [facility] Laural GoldenJohnson Branch  MRN:  [id number]  Subjective:    The patient appears to be doing fairly well today.  He says "I feel great".  He denies any current feelings of hopelessness or anhedonia and feels the medications have worked well for his depression.  He is sleeping fairly well, over 7 hours last night per nursing.  The only side effect he has noted from the medication is decreased appetite which occurs with the Concerta.  He denies any current active or passive suicidal thoughts or psychotic symptoms.  He denies any problems with insomnia currently.  He has been attending groups and participating.  He is learning more about coping skills and time spent discussing the need for the patient to learn distress tolerance.  He denies any new somatic complaints.  Vital signs are stable.  He is eating fairly well.  His parents have come to visit him as well as his girlfriend.  Past psychiatric history. 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 He attempted suicide recently by overdose on Tylenol and was [facility], given diagnosis of bipolar disorder and started on Abilify.  Family psychiatric history. Unknown, patient is adopted.  Social history. He comes from a military family with his both grandfathers and the mother serving during conflicts. Military career was his goal. Has private insurance.   Principal Problem: Bipolar I disorder, most recent episode depressed, severe without psychotic features (HCC) Diagnosis: Principal Problem:   Bipolar I disorder, most recent episode depressed, severe without psychotic features (HCC) Active Problems:   Self-inflicted laceration of wrist   PTSD (post-traumatic stress disorder)   OCD (obsessive compulsive disorder)   Attention deficit hyperactivity disorder  (ADHD)   Severe recurrent major depression without psychotic features (HCC)  Total Time spent with patient:  20 minutes   Past Medical History:  Past Medical History:  Diagnosis Date  . ADHD   . Adopted    no family medical hx  . Asthma   . Kidney stones   . Wears contact lenses     Past Surgical History:  Procedure Laterality Date  . LITHOTRIPSY    . TONSILLECTOMY N/A 12/13/2016   Procedure: TONSILLECTOMY;  Surgeon: Bud Facereighton Vaught, MD;  [facility];  Service: ENT;  Laterality: N/A;  . ULNAR NERVE TRANSPOSITION Left 10/09/2018   Procedure: SUBCUTANEOUS TRANSPOSITION OF THE ULNAR NERVE OF LEFT ELBOW;  Surgeon: Christena FlakePoggi, John J, MD;  [facility];  Service: Orthopedics;  Laterality: Left;  . WISDOM TOOTH EXTRACTION      Social History:  Social History   Substance and Sexual Activity  Alcohol Use No     Social History   Substance and Sexual Activity  Drug Use No    Social History   Socioeconomic History  . Marital status: Single    Spouse name: Not on file  . Number of children: Not on file  . Years of education: Not on file  . Highest education level: Not on file  Occupational History  . Not on file  Social Needs  . Financial resource strain: Not on file  . Food insecurity:    Worry: Not on file    Inability: Not on file  . Transportation needs:    Medical: Not on file    Non-medical: Not  on file  Tobacco Use  . Smoking status: Never Smoker  . Smokeless tobacco: Never Used  Substance and Sexual Activity  . Alcohol use: No  . Drug use: No  . Sexual activity: Not on file  Lifestyle  . Physical activity:    Days per week: Not on file    Minutes per session: Not on file  . Stress: Not on file  Relationships  . Social connections:    Talks on phone: Not on file    Gets together: Not on file    Attends religious service: Not on file    Active member of club or organization: Not on file    Attends meetings of clubs or  organizations: Not on file    Relationship status: Not on file  Other Topics Concern  . Not on file  Social History Narrative  . Not on file   Additional Social History:  Specify valuables returned: clothing          Sleep: Good  Appetite: Good  Current Medications: Current Facility-Administered Medications  Medication Dose Route Frequency Provider Last Rate Last Dose  . acetaminophen (TYLENOL) tablet 650 mg  650 mg Oral Q6H PRN Clapacs, John T, MD      . alum & mag hydroxide-simeth (MAALOX/MYLANTA) 200-200-20 MG/5ML suspension 30 mL  30 mL Oral Q4H PRN Clapacs, John T, MD      . ARIPiprazole (ABILIFY) tablet 10 mg  10 mg Oral Daily Clapacs, Jackquline Denmark, MD   10 mg at 12/15/18 0747  . fluvoxaMINE (LUVOX) tablet 100 mg  100 mg Oral QHS Pucilowska, Jolanta B, MD   100 mg at 12/14/18 2103  . hydrOXYzine (ATARAX/VISTARIL) tablet 50 mg  50 mg Oral TID PRN Clapacs, Jackquline Denmark, MD   50 mg at 12/14/18 2103  . magnesium hydroxide (MILK OF MAGNESIA) suspension 30 mL  30 mL Oral Daily PRN Clapacs, John T, MD      . methylphenidate (CONCERTA) CR tablet 36 mg  36 mg Oral Daily Pucilowska, Jolanta B, MD   36 mg at 12/15/18 0747  . prazosin (MINIPRESS) capsule 2 mg  2 mg Oral BID Pucilowska, Jolanta B, MD   2 mg at 12/15/18 0747    Lab Results: No results found for this or any previous visit (from the past 48 hour(s)).  Blood Alcohol level:  Lab Results  Component Value Date   ETH <10 12/09/2018    Metabolic Disorder Labs: Lab Results  Component Value Date   HGBA1C 4.8 12/11/2018   MPG 91.06 12/11/2018   No results found for: PROLACTIN Lab Results  Component Value Date   CHOL 172 12/11/2018   TRIG 137 12/11/2018   HDL 33 (L) 12/11/2018   CHOLHDL 5.2 12/11/2018   VLDL 27 12/11/2018   LDLCALC 112 (H) 12/11/2018    Physical Findings: AIMS: Facial and Oral Movements Muscles of Facial Expression: None, normal Lips and Perioral Area: None, normal Jaw: None, normal Tongue: None,  normal,Extremity Movements Upper (arms, wrists, hands, fingers): None, normal Lower (legs, knees, ankles, toes): None, normal, Trunk Movements Neck, shoulders, hips: None, normal, Overall Severity Severity of abnormal movements (highest score from questions above): None, normal Incapacitation due to abnormal movements: None, normal Patient's awareness of abnormal movements (rate only patient's report): No Awareness, Dental Status Current problems with teeth and/or dentures?: No Does patient usually wear dentures?: No  CIWA:    COWS:     Musculoskeletal: Strength & Muscle Tone: within normal limits Gait & Station:  Normal Patient leans: N/A  Psychiatric Specialty Exam: Physical Exam  Nursing note and vitals reviewed. Psychiatric: He has a normal mood and affect. His speech is normal and behavior is normal. Judgment and thought content normal. Cognition and memory are normal. He does not express impulsivity.    Review of Systems  Constitutional: Negative.   HENT: Negative.   Eyes: Negative.   Respiratory: Negative.   Cardiovascular: Negative.   Gastrointestinal: Negative.   Musculoskeletal: Negative.   Skin: Negative.   Neurological: Negative.   Endo/Heme/Allergies: Negative.     Blood pressure 127/74, pulse 64, temperature 97.9 F (36.6 C), temperature source Oral, resp. rate 16, height 5' 6.5" (1.689 m), weight 83.9 kg, SpO2 99 %.Body mass index is 29.41 kg/m.  General Appearance: Casual  Eye Contact:  Good  Speech:  Regular, rate and rhythm  Volume:  Normal  Mood:  "I feel great"  Affect:  Fulll and congruent  Thought Process:  Coherent, Goal Directed and Linear  Orientation:  Full (Time, Place, and Person)  Thought Content:  WDL  Suicidal Thoughts:  No  Homicidal Thoughts:  No  Memory:  Immediate;   Fair Recent;   Fair Remote;   Fair  Judgement:  Impaired  Insight:  Shallow  Psychomotor Activity:  Normal  Concentration:  Concentration: Fair and Attention Span:  Fair  Recall:  FiservFair  Fund of Knowledge:  Fair  Language:  Fair  Akathisia:  No  Handed:  Right  AIMS (if indicated):     Assets:  Communication Skills Desire for Improvement Financial Resources/Insurance Intimacy Physical Health Resilience Transportation Vocational/Educational  ADL's:  Intact  Cognition:  WNL  Sleep:  Number of Hours: 7.15     Treatment Plan Summary: Daily contact with patient to assess and evaluate symptoms and progress in treatment and Medication management   Mr. Tom Branch is a 22 year old male with a history of depression and PTSD admitted after suicide attempt by superficial cutting his wrist with s steak knife. He accepted medication adjustments and tolerated them well. Dr Jennet MaduroPucilowska has spoken with his family who insist he stay in the hosptital to see how Concerta interacts with other meds.   #Depression, improved -The patient is doing well and no current active or passive suicidal thoughts.  No urges to cut -We will focus on decreasing self-harm behaviors by improving coping skills and distraction techniques for distress tolerance -Continue Luvox 100 mg p.o. nightly and Lunesta 2 mg p.o. nightly  ADHD -The patient was restarted on Concerta at 32 mg p.o. daily.  He has been on stimulants since childhood.   PTSD -Continue Minipress 2 mg p.o. twice daily for nightmares and flashbacks  #Labs -lipid panel, TSH, A1C are normal -EKGreviewed, NSR with QTc 383  #Disposition  -The patient will return home to live with his family -Psychotropic medication management follow-up appointment will be with [facility]    Darliss RidgelAarti K , MD 12/15/2018, 1:02 PM

## 2018-12-16 MED ORDER — METHYLPHENIDATE HCL ER (OSM) 36 MG PO TBCR: 36.0000 mg | EXTENDED_RELEASE_TABLET | Freq: Every day | ORAL | 0 refills | Status: DC

## 2018-12-16 NOTE — Progress Notes (Signed)
Patient alert and oriented x 4, denies SI/HI/AVH, affect is flat but brightens upon approach no distress noted, makes appropriate eye contact, interacting with peers also appropriately. Patient's thoughts are organized and coherent, compliant with medication, 15 minutes safety checks monitored will continue to monitor.

## 2018-12-16 NOTE — Tx Team (Signed)
Interdisciplinary Treatment and Diagnostic Plan Update  12/16/2018 Time of Session: [address] Tom Branch MRN: [id number]  Principal Diagnosis: Bipolar I disorder, most recent episode depressed, severe without psychotic features (HCC)  Secondary Diagnoses: Principal Problem:   Bipolar I disorder, most recent episode depressed, severe without psychotic features (HCC) Active Problems:   Self-inflicted laceration of wrist   PTSD (post-traumatic stress disorder)   OCD (obsessive compulsive disorder)   Attention deficit hyperactivity disorder (ADHD)   Severe recurrent major depression without psychotic features (HCC)   Current Medications:  Current Facility-Administered Medications  Medication Dose Route Frequency Provider Last Rate Last Dose  . acetaminophen (TYLENOL) tablet 650 mg  650 mg Oral Q6H PRN Clapacs, John T, MD      . alum & mag hydroxide-simeth (MAALOX/MYLANTA) 200-200-20 MG/5ML suspension 30 mL  30 mL Oral Q4H PRN Clapacs, John T, MD      . ARIPiprazole (ABILIFY) tablet 10 mg  10 mg Oral Daily Clapacs, Jackquline DenmarkJohn T, MD   10 mg at 12/16/18 0751  . fluvoxaMINE (LUVOX) tablet 100 mg  100 mg Oral QHS Pucilowska, Jolanta B, MD   100 mg at 12/15/18 2136  . hydrOXYzine (ATARAX/VISTARIL) tablet 50 mg  50 mg Oral TID PRN Clapacs, Jackquline DenmarkJohn T, MD   50 mg at 12/15/18 2138  . magnesium hydroxide (MILK OF MAGNESIA) suspension 30 mL  30 mL Oral Daily PRN Clapacs, John T, MD      . methylphenidate (CONCERTA) CR tablet 36 mg  36 mg Oral Daily Pucilowska, Jolanta B, MD   36 mg at 12/16/18 0751  . prazosin (MINIPRESS) capsule 2 mg  2 mg Oral BID Pucilowska, Jolanta B, MD   2 mg at 12/16/18 0751   PTA Medications: Medications Prior to Admission  Medication Sig Dispense Refill Last Dose  . albuterol (PROVENTIL HFA;VENTOLIN HFA) 108 (90 Base) MCG/ACT inhaler Inhale into the lungs every 6 (six) hours as needed for wheezing or shortness of breath.   prn at prn  . EPINEPHrine (EPIPEN 2-PAK)  0.3 mg/0.3 mL IJ SOAJ injection Inject 0.3 mLs (0.3 mg total) into the muscle once. 1 Device 0 prn at prn  . esomeprazole (NEXIUM) 20 MG capsule Take 20 mg by mouth daily at 12 noon.   prn at prn  . ibuprofen (ADVIL,MOTRIN) 200 MG tablet Take 4 tablets by mouth as needed.   prn at prn  . loratadine (CLARITIN) 10 MG tablet Take 1 tablet by mouth as needed.   prn at prn  . Melatonin 3 MG TABS Take 12 mg by mouth at bedtime as needed.   Past Month at prn  . methylphenidate 36 MG PO CR tablet Take 72 mg by mouth daily.    12/09/2018 at 0800  . Multiple Vitamin (MULTI-VITAMINS) TABS Take 1 tablet by mouth daily.   Past Week at Unknown time  . naproxen sodium (ALEVE) 220 MG tablet Take 1 tablet by mouth 3 (three) times daily as needed.   prn at prn  . Omega-3 Fatty Acids (FISH OIL PO) Take by mouth daily.   12/09/2018 at 0800  . oxyCODONE (ROXICODONE) 5 MG immediate release tablet Take 1-2 tablets (5-10 mg total) by mouth every 4 (four) hours as needed for severe pain. (Patient not taking: Reported on 12/10/2018) 40 tablet 0 Not Taking at Unknown time  . [DISCONTINUED] ARIPiprazole (ABILIFY) 10 MG tablet Take 10 mg by mouth daily.   12/09/2018 at 0800    Patient Stressors: Health problems Traumatic event  Patient  Strengths: Capable of independent living Advertising account executive for treatment/growth Religious Affiliation Supportive family/friends Work skills  Treatment Modalities: Medication Management, Group therapy, Case management,  1 to 1 session with clinician, Psychoeducation, Recreational therapy.   Physician Treatment Plan for Primary Diagnosis: Bipolar I disorder, most recent episode depressed, severe without psychotic features (HCC) Long Term Goal(s): Improvement in symptoms so as ready for discharge Improvement in symptoms so as ready for discharge   Short Term Goals: Ability to identify changes in lifestyle to reduce recurrence of condition will  improve Ability to verbalize feelings will improve Ability to disclose and discuss suicidal ideas Ability to demonstrate self-control will improve Ability to identify and develop effective coping behaviors will improve Ability to maintain clinical measurements within normal limits will improve Ability to identify triggers associated with substance abuse/mental health issues will improve NA  Medication Management: Evaluate patient's response, side effects, and tolerance of medication regimen.  Therapeutic Interventions: 1 to 1 sessions, Unit Group sessions and Medication administration.  Evaluation of Outcomes: Progressing  Physician Treatment Plan for Secondary Diagnosis: Principal Problem:   Bipolar I disorder, most recent episode depressed, severe without psychotic features (HCC) Active Problems:   Self-inflicted laceration of wrist   PTSD (post-traumatic stress disorder)   OCD (obsessive compulsive disorder)   Attention deficit hyperactivity disorder (ADHD)   Severe recurrent major depression without psychotic features (HCC)  Long Term Goal(s): Improvement in symptoms so as ready for discharge Improvement in symptoms so as ready for discharge   Short Term Goals: Ability to identify changes in lifestyle to reduce recurrence of condition will improve Ability to verbalize feelings will improve Ability to disclose and discuss suicidal ideas Ability to demonstrate self-control will improve Ability to identify and develop effective coping behaviors will improve Ability to maintain clinical measurements within normal limits will improve Ability to identify triggers associated with substance abuse/mental health issues will improve NA     Medication Management: Evaluate patient's response, side effects, and tolerance of medication regimen.  Therapeutic Interventions: 1 to 1 sessions, Unit Group sessions and Medication administration.  Evaluation of Outcomes: Progressing   RN  Treatment Plan for Primary Diagnosis: Bipolar I disorder, most recent episode depressed, severe without psychotic features (HCC) Long Term Goal(s): Knowledge of disease and therapeutic regimen to maintain health will improve  Short Term Goals: Ability to verbalize feelings will improve, Ability to disclose and discuss suicidal ideas, Ability to identify and develop effective coping behaviors will improve and Compliance with prescribed medications will improve  Medication Management: RN will administer medications as ordered by provider, will assess and evaluate patient's response and provide education to patient for prescribed medication. RN will report any adverse and/or side effects to prescribing provider.  Therapeutic Interventions: 1 on 1 counseling sessions, Psychoeducation, Medication administration, Evaluate responses to treatment, Monitor vital signs and CBGs as ordered, Perform/monitor CIWA, COWS, AIMS and Fall Risk screenings as ordered, Perform wound care treatments as ordered.  Evaluation of Outcomes: Progressing   LCSW Treatment Plan for Primary Diagnosis: Bipolar I disorder, most recent episode depressed, severe without psychotic features (HCC) Long Term Goal(s): Safe transition to appropriate next level of care at discharge, Engage patient in therapeutic group addressing interpersonal concerns.  Short Term Goals: Engage patient in aftercare planning with referrals and resources  Therapeutic Interventions: Assess for all discharge needs, 1 to 1 time with Social worker, Explore available resources and support systems, Assess for adequacy in community support network, Educate family and significant other(s) on suicide prevention,  Complete Psychosocial Assessment, Interpersonal group therapy.  Evaluation of Outcomes: Progressing   Progress in Treatment: Attending groups: Yes Participating in groups: Yes. Taking medication as prescribed: Yes. Toleration medication:  Yes. Family/Significant other contact made: Yes, individual(s) contacted:  Malva LimesMark Proia, father Patient understands diagnosis: Yes. Discussing patient identified problems/goals with staff: Yes. Medical problems stabilized or resolved: Yes. Denies suicidal/homicidal ideation: Yes. Issues/concerns per patient self-inventory: No. Other: NA  New problem(s) identified: No, Describe:  None reported  New Short Term/Long Term Goal(s):"Find better coping methods"  Patient Goals:  "Find better coping methods"  Discharge Plan or Barriers: Pt will return home and follow up with outpatient treatment  Reason for Continuation of Hospitalization: Medication stabilization  Estimated Length of Stay: Discharge on 12/23  Recreational Therapy: Patient Stressors: N/A Patient Goal: Patient will identify 3 triggers to anxiety within 5 recreation therapy group sessions  Attendees: Patient: 12/16/2018 3:41 PM  Physician: Lilia ProJolenta Pucilowska, MD 12/16/2018 3:41 PM  Nursing:  12/16/2018 3:41 PM  RN Care Manager: 12/16/2018 3:41 PM  Social Worker: Lowella Dandyarren Bernice Mcauliffe LCSW 12/16/2018 3:41 PM  Recreational Therapist:  12/16/2018 3:41 PM  Other: Mordecai RasmussenJohn Clapacs, MD 12/16/2018 3:41 PM  Other:  12/16/2018 3:41 PM  Other: 12/16/2018 3:41 PM    Scribe for Treatment Team: Suzan SlickARREN T Shanyia Stines, LCSW 12/16/2018 3:41 PM

## 2018-12-16 NOTE — Progress Notes (Signed)
Recreation Therapy Notes  Date: 12/16/2018  Time: 9:30 am  Location: Craft Room  Behavioral response: Appropriate   Intervention Topic: Goals  Discussion/Intervention:  Group content on today was focused on goals. Patients described what goals are and how they define goals. Individuals expressed how they go about setting goals and reaching them. The group identified how important goals are and if they make short term goals to reach long term goals. Patients described how many goals they work on at a time and what affects them not reaching their goal. Individuals described how much time they put into planning and obtaining their goals. The group participated in the intervention "My Goal Board" and made personal goal boards to help them achieve their goal. Clinical Observations/Feedback:  Patient came to group and defined goals as something you want to achieve and push forward to. He identified a a goal he has is to get his bachelors degree in art. Individual was social with peers and staff while participating in the intervention.  Capers Hagmann LRT/CTRS         Cashus Halterman 12/16/2018 11:40 AM

## 2018-12-16 NOTE — Progress Notes (Signed)
Recreation Therapy Notes  INPATIENT RECREATION TR PLAN  Patient Details Name: Tom Branch MRN: [id number] DOB: Jun 29, 1996 Today's Date: 12/16/2018  Rec Therapy Plan Is patient appropriate for Therapeutic Recreation?: Yes Treatment times per week: at least 3 Estimated Length of Stay: 5-7 days TR Treatment/Interventions: Group participation (Comment)  Discharge Criteria Pt will be discharged from therapy if:: Discharged Treatment plan/goals/alternatives discussed and agreed upon by:: Patient/family  Discharge Summary Short term goals set: Patient will identify 3 triggers to anxiety within 5 recreation therapy group sessions Short term goals met: Complete Progress toward goals comments: Groups attended Which groups?: Goal setting, Leisure education, Self-esteem, Other (Comment)(Relaxation) Reason goals not met: N/A Therapeutic equipment acquired: N/A Reason patient discharged from therapy: Discharge from hospital Pt/family agrees with progress & goals achieved: Yes Date patient discharged from therapy: 12/16/18   Jaleya Pebley 12/16/2018, 2:03 PM

## 2018-12-16 NOTE — Progress Notes (Signed)
  [facility] Dis[facility] :  Will you be returning to the same living situation after discharge:  Yes,  pt lives with parents At discharge, do you have transportation home?: Yes,  parents will pick up Do you have the ability to pay for your medications: Yes,  insurance  Release of information consent forms completed and in the chart;  Patient's signature needed at discharge.  Patient to Follow up at: Follow-up Information    [facility] Go on 12/19/2018.   Why:  Please go to [facility] on Thursday, December 26th at 930am for a hospital follow up appointment. You are scheduled to meet with Dr. Marguerite OleaMoffett on Monday, December 23, 2018 at 1240pm. Thank you. Contact information: [address] [place] [address] [phone]           Next level of care provider has [facility] to [facility] Link:no  Safety Planning and Suicide Prevention discussed: Yes,  Malva LimesMark Bodnar, father  Have you used any form of tobacco in the last 30 days? (Cigarettes, Smokeless Tobacco, Cigars, and/or Pipes): No  Has patient been referred to the Quitline?: N/A patient is not a smoker  Patient has been referred for addiction treatment: N/A  Suzan SlickDARREN T Dilan Novosad, LCSW 12/16/2018, 10:12 AM

## 2018-12-16 NOTE — Progress Notes (Signed)
D: Patient is aware of  Discharge this shift .Patient denies suicidal /homicidal ideations. Patient received all belongings brought in  A: No Storage medications. Writer reviewed Discharge Summary, Suicide Risk Assessment, and Transitional Record. Patient also received Prescriptions   from  MD  A letter for return to work was given to patient   R: Patient left unit with no questions  Or concerns  With family

## 2019-01-01 DIAGNOSIS — R112 Nausea with vomiting, unspecified: Secondary | ICD-10-CM | POA: Diagnosis not present

## 2019-01-03 DIAGNOSIS — Z9189 Other specified personal risk factors, not elsewhere classified: Secondary | ICD-10-CM | POA: Diagnosis not present

## 2019-01-03 DIAGNOSIS — R8290 Unspecified abnormal findings in urine: Secondary | ICD-10-CM | POA: Diagnosis not present

## 2019-01-03 DIAGNOSIS — J4520 Mild intermittent asthma, uncomplicated: Secondary | ICD-10-CM | POA: Diagnosis not present

## 2019-01-10 DIAGNOSIS — F319 Bipolar disorder, unspecified: Secondary | ICD-10-CM | POA: Insufficient documentation

## 2019-01-10 DIAGNOSIS — R635 Abnormal weight gain: Secondary | ICD-10-CM | POA: Diagnosis not present

## 2019-05-07 DIAGNOSIS — R8290 Unspecified abnormal findings in urine: Secondary | ICD-10-CM | POA: Diagnosis not present

## 2020-01-09 ENCOUNTER — Encounter: Payer: Self-pay | Admitting: Emergency Medicine

## 2020-01-09 ENCOUNTER — Emergency Department
Admission: EM | Admit: 2020-01-09 | Discharge: 2020-01-09 | Disposition: A | Discharge status: Home / Self Care | Payer: 59 | Attending: Emergency Medicine | Admitting: Emergency Medicine

## 2020-01-09 ENCOUNTER — Other Ambulatory Visit: Payer: Self-pay

## 2020-01-09 DIAGNOSIS — J45909 Unspecified asthma, uncomplicated: Secondary | ICD-10-CM | POA: Insufficient documentation

## 2020-01-09 DIAGNOSIS — Z20822 Contact with and (suspected) exposure to covid-19: Secondary | ICD-10-CM | POA: Diagnosis not present

## 2020-01-09 DIAGNOSIS — Z79899 Other long term (current) drug therapy: Secondary | ICD-10-CM | POA: Diagnosis not present

## 2020-01-09 DIAGNOSIS — B349 Viral infection, unspecified: Secondary | ICD-10-CM | POA: Insufficient documentation

## 2020-01-09 DIAGNOSIS — R438 Other disturbances of smell and taste: Secondary | ICD-10-CM | POA: Diagnosis present

## 2020-01-09 MED ORDER — ONDANSETRON HCL 4 MG PO TABS: 4.0000 mg | ORAL_TABLET | Freq: Three times a day (TID) | ORAL | 0 refills | Status: DC | PRN

## 2020-01-09 MED ORDER — DICYCLOMINE HCL 10 MG PO CAPS: 10.0000 mg | ORAL_CAPSULE | Freq: Four times a day (QID) | ORAL | 0 refills | Status: DC

## 2020-01-09 NOTE — ED Provider Notes (Signed)
Emergency Department Provider Note  ____________________________________________  Time seen: Approximately 9:30 PM  I have reviewed the triage vital signs and the nursing notes.   HISTORY  Chief Complaint COVID sxs   Historian Patient     HPI Tom Branch is a 24 y.o. male presents to the emergency department with loss of taste and smell that started 4 to 5 hours ago.  Patient reports multiple potential contacts with COVID-19. He denies chest pain, chest tightness or abdominal pain.  He has been afebrile.  No other alleviating measures have been attempted.   Past Medical History:  Diagnosis Date  . ADHD   . Adopted    no family medical hx  . Asthma   . Kidney stones   . Wears contact lenses      Immunizations up to date:  Yes.     Past Medical History:  Diagnosis Date  . ADHD   . Adopted    no family medical hx  . Asthma   . Kidney stones   . Wears contact lenses     Patient Active Problem List   Diagnosis Date Noted  . Attention deficit hyperactivity disorder (ADHD) 12/13/2018  . Severe recurrent major depression without psychotic features ([place])   . PTSD (post-traumatic stress disorder) 12/11/2018  . OCD (obsessive compulsive disorder) 12/11/2018  . Self-inflicted laceration of wrist ([place]) 12/10/2018  . Bipolar I disorder, most recent episode depressed, severe without psychotic features ([place]) 12/10/2018    Past Surgical History:  Procedure Laterality Date  . LITHOTRIPSY    . TONSILLECTOMY N/A 12/13/2016   Procedure: TONSILLECTOMY;  Surgeon: Carloyn Manner, MD;  Location: [facility];  Service: ENT;  Laterality: N/A;  . ULNAR NERVE TRANSPOSITION Left 10/09/2018   Procedure: SUBCUTANEOUS TRANSPOSITION OF THE ULNAR NERVE OF LEFT ELBOW;  Surgeon: Corky Mull, MD;  Location: [facility];  Service: Orthopedics;  Laterality: Left;  . WISDOM TOOTH EXTRACTION      Prior to Admission medications   Medication Sig Start  Date End Date Taking? Authorizing Provider  albuterol (PROVENTIL HFA;VENTOLIN HFA) 108 (90 Base) MCG/ACT inhaler Inhale into the lungs every 6 (six) hours as needed for wheezing or shortness of breath.    [provider]  ARIPiprazole (ABILIFY) 10 MG tablet Take 1 tablet (10 mg total) by mouth daily. 12/13/18   Pucilowska, Jolanta B, MD  EPINEPHrine (EPIPEN 2-PAK) 0.3 mg/0.3 mL IJ SOAJ injection Inject 0.3 mLs (0.3 mg total) into the muscle once. 02/09/16   Loney Hering, MD  eszopiclone (LUNESTA) 2 MG TABS tablet Take 1 tablet (2 mg total) by mouth at bedtime. Take immediately before bedtime 12/13/18 12/13/19  Pucilowska, Herma Ard B, MD  fluvoxaMINE (LUVOX) 100 MG tablet Take 1 tablet (100 mg total) by mouth at bedtime. 12/13/18   Pucilowska, Herma Ard B, MD  loratadine (CLARITIN) 10 MG tablet Take 1 tablet by mouth as needed.    [provider]  Melatonin 3 MG TABS Take 12 mg by mouth at bedtime as needed.    [provider]  methylphenidate 36 MG PO CR tablet Take 72 mg by mouth daily.     [provider]  methylphenidate 36 MG PO CR tablet Take 1 tablet (36 mg total) by mouth daily. 12/17/18   Pucilowska, Wardell Honour, MD  Multiple Vitamin (MULTI-VITAMINS) TABS Take 1 tablet by mouth daily.    [provider]  Omega-3 Fatty Acids (FISH OIL PO) Take by mouth daily.    [provider]  prazosin (MINIPRESS) 2 MG capsule Take 1 capsule (2 mg total) by mouth 2 (two) times daily. 12/13/18   Pucilowska, Ellin Goodie, MD    Allergies Dilaudid [hydromorphone hcl], Latex, Lactose intolerance (gi), Meat extract, and Other  No family history on file.  Social History Social History   Tobacco Use  . Smoking status: Never Smoker  . Smokeless tobacco: Never Used  Substance Use Topics  . Alcohol use: No  . Drug use: No     Review of Systems  Constitutional: Patient has loss of taste and smell.  Eyes:  No discharge ENT: No upper respiratory  complaints. Respiratory: no cough. No SOB/ use of accessory muscles to breath Gastrointestinal:   No nausea, no vomiting.  No diarrhea.  No constipation. Musculoskeletal: Negative for musculoskeletal pain. Skin: Negative for rash, abrasions, lacerations, ecchymosis.    ____________________________________________   PHYSICAL EXAM:  VITAL SIGNS: ED Triage Vitals  Enc Vitals Group     BP 01/09/20 1932 (!) 145/79     Pulse Rate 01/09/20 1932 84     Resp 01/09/20 1932 16     Temp 01/09/20 1932 98.1 F (36.7 C)     Temp Source 01/09/20 1932 Oral     SpO2 01/09/20 1932 97 %     Weight 01/09/20 1933 220 lb (99.8 kg)     Height 01/09/20 1933 5\' 8"  (1.727 m)     Head Circumference --      Peak Flow --      Pain Score 01/09/20 1933 4     Pain Loc --      Pain Edu? --      Excl. in GC? --      Constitutional: Alert and oriented. Well appearing and in no acute distress. Eyes: Conjunctivae are normal. PERRL. EOMI. Head: Atraumatic. ENT:      Ears: TMs are pearly.       Nose: No congestion/rhinnorhea.      Mouth/Throat: Mucous membranes are moist.  Neck: No stridor.  No cervical spine tenderness to palpation. Cardiovascular: Normal rate, regular rhythm. Normal S1 and S2.  Good peripheral circulation. Respiratory: Normal respiratory effort without tachypnea or retractions. Lungs CTAB. Good air entry to the bases with no decreased or absent breath sounds Gastrointestinal: Bowel sounds x 4 quadrants. Soft and nontender to palpation. No guarding or rigidity. No distention. Musculoskeletal: Full range of motion to all extremities. No obvious deformities noted Neurologic:  Normal for age. No gross focal neurologic deficits are appreciated.  Skin:  Skin is warm, dry and intact. No rash noted. Psychiatric: Mood and affect are normal for age. Speech and behavior are normal.   ____________________________________________   LABS (all labs ordered are listed, but only abnormal results are  displayed)  Labs Reviewed  SARS CORONAVIRUS 2 (TAT 6-24 HRS)   ____________________________________________  EKG   ____________________________________________  RADIOLOGY   No results found.  ____________________________________________    PROCEDURES  Procedure(s) performed:     Procedures     Medications - No data to display   ____________________________________________   INITIAL IMPRESSION / ASSESSMENT AND PLAN / ED COURSE  Pertinent labs & imaging results that were available during my care of the patient were reviewed by me and considered in my medical decision making (see chart for details).      Assessment and Plan: Covid 42. 24 year old male presents to the emergency department with loss of taste and smell and request for COVID-19 testing.  Send off COVID-19 testing  is in process at this time.  Patient was discharged with Zofran and Bentyl in case vomiting or diarrhea occurs in the near future.  He assured me that he has an albuterol inhaler at home.  Return precautions were given to return for worsening symptoms. All patient questions were answered.   Tom Branch was evaluated in Emergency Department on 01/09/2020 for the symptoms described in the history of present illness. He was evaluated in the context of the global COVID-19 pandemic, which necessitated consideration that the patient might be at risk for infection with the SARS-CoV-2 virus that causes COVID-19. Institutional protocols and algorithms that pertain to the evaluation of patients at risk for COVID-19 are in a state of rapid change based on information released by regulatory bodies including the CDC and federal and state organizations. These policies and algorithms were followed during the patient's care in the ED.    ____________________________________________  FINAL CLINICAL IMPRESSION(S) / ED DIAGNOSES  Final diagnoses:  Viral illness      NEW MEDICATIONS  STARTED DURING THIS VISIT:  ED Discharge Orders    None          This chart was dictated using voice recognition software/Dragon. Despite best efforts to proofread, errors can occur which can change the meaning. Any change was purely unintentional.     Orvil Feil, PA-C 01/09/20 2135    Concha Se, MD 01/09/20 2259

## 2020-01-09 NOTE — ED Notes (Signed)
Reviewed discharge instructions, follow-up care, and OTC medications with patient. Patient verbalized understanding of all information reviewed. Patient stable, with no distress noted at this time.    

## 2020-01-09 NOTE — ED Triage Notes (Signed)
Pt to ED via PV c/o COVID sxs that started today. Pt is in NAD

## 2020-01-10 LAB — SARS CORONAVIRUS 2 (TAT 6-24 HRS): SARS Coronavirus 2: NEGATIVE

## 2021-02-04 ENCOUNTER — Other Ambulatory Visit (HOSPITAL_COMMUNITY): Payer: Self-pay | Admitting: Internal Medicine

## 2021-02-04 ENCOUNTER — Other Ambulatory Visit: Payer: Self-pay | Admitting: Internal Medicine

## 2021-02-04 DIAGNOSIS — R7989 Other specified abnormal findings of blood chemistry: Secondary | ICD-10-CM

## 2021-02-04 DIAGNOSIS — R945 Abnormal results of liver function studies: Secondary | ICD-10-CM

## 2021-02-09 ENCOUNTER — Other Ambulatory Visit: Payer: Self-pay

## 2021-02-09 ENCOUNTER — Ambulatory Visit
Admission: RE | Admit: 2021-02-09 | Discharge: 2021-02-09 | Disposition: A | Discharge status: Home / Self Care | Payer: 59 | Source: Ambulatory Visit | Attending: Internal Medicine | Admitting: Internal Medicine

## 2021-02-09 DIAGNOSIS — R945 Abnormal results of liver function studies: Secondary | ICD-10-CM | POA: Diagnosis present

## 2021-02-09 DIAGNOSIS — R7989 Other specified abnormal findings of blood chemistry: Secondary | ICD-10-CM

## 2021-02-22 ENCOUNTER — Ambulatory Visit: Payer: Self-pay | Admitting: Surgery

## 2021-02-22 NOTE — H&P (Signed)
Subjective:   CC: Biliary colic [K80.50]  HPI:  Tom Branch is a 25 y.o. male who was referred by Margarito Liner* for evaluation of above CC. Symptoms were first noted a few days ago. Pain is sharp and intermittent, confined to the right upper quadrant, without radiation.  Associated with nausea, exacerbated by nothing specific.     Past Medical History:  has a past medical history of ADHD (attention deficit hyperactivity disorder), Asthma without status asthmaticus, unspecified, and Strep throat.  Past Surgical History:  has a past surgical history that includes Dental work at age 59; Tonsillectomy (12/15/2016); and Subcutaneous anterior transposition ulnar nerve, left elbow. (Left, 10/09/2018).  Family History: family history includes No Known Problems in his father; Stroke in his mother. He was adopted.  Social History:  reports that he has never smoked. He has never used smokeless tobacco. He reports that he does not drink alcohol and does not use drugs.  Current Medications: has a current medication list which includes the following prescription(s): albuterol, epinephrine, esomeprazole, ibuprofen, methylphenidate hcl, multivitamin, loratadine, melatonin, methylphenidate hcl, and naproxen sodium.  Allergies:  Allergies as of 02/22/2021 - Reviewed 02/22/2021  Allergen Reaction Noted  . Hydromorphone hcl Hives 06/09/2016  . Latex Hives 06/09/2016  . Other Other (See Comments) 06/02/2014    ROS:  A 15 point review of systems was performed and pertinent positives and negatives noted in HPI    Objective:     BP 123/79   Pulse 88   Ht 170.2 cm (5\' 7" )   Wt (!) 104.8 kg (231 lb)   BMI 36.18 kg/m    Constitutional :  alert, appears stated age, cooperative and no distress  Lymphatics/Throat:  no asymmetry, masses, or scars  Respiratory:  clear to auscultation bilaterally  Cardiovascular:  regular rate and rhythm  Gastrointestinal: soft, no guarding, but TTP in RUQ.     Musculoskeletal: Steady gait and movement  Skin: Cool and moist,   Psychiatric: Normal affect, non-agitated, not confused       LABS:  n/a  RADS: CLINICAL DATA: Abnormal LFTs   EXAM:  ULTRASOUND ABDOMEN LIMITED RIGHT UPPER QUADRANT   COMPARISON: June 09, 2016   FINDINGS:  Gallbladder:   There are gallstones without evidence for acute cholecystitis. There  is no gallbladder wall thickening orpericholecystic free fluid. The  sonographic June 11, 2016 sign is negative.   Common bile duct:   Diameter: 3 mm   Liver:   Diffuse increased echogenicity with slightly heterogeneous liver.  Appearance typically secondary to fatty infiltration. Fibrosis  secondary consideration. No secondary findings of cirrhosis noted.  No focal hepatic lesion or intrahepatic biliary duct dilatation.  Portal vein is patent on color Doppler imaging with normal direction  of blood flow towards the liver.   Other: None.   IMPRESSION:  1. There is cholelithiasis without secondary signs of acute  cholecystitis.  2. Hepatic steatosis    Electronically Signed  By: Eulah Pont M.D.  On: 02/10/2021 22:47  Procedure Note  02/12/2021, MD - 02/10/2021  Formatting of this note might be different from the original.  CLINICAL DATA: Abnormal LFTs   EXAM:  ULTRASOUND ABDOMEN LIMITED RIGHT UPPER QUADRANT   COMPARISON: June 09, 2016   FINDINGS:  Gallbladder:   There are gallstones without evidence for acute cholecystitis. There  is no gallbladder wall thickening or pericholecystic free fluid. The  sonographic June 11, 2016 sign is negative.   Common bile duct:   Diameter: 3 mm   Liver:  Diffuse increased echogenicity with slightly heterogeneous liver.  Appearance typically secondary to fatty infiltration. Fibrosis  secondary consideration. No secondary findings of cirrhosis noted.  No focal hepatic lesion or intrahepatic biliary duct dilatation.  Portal vein is patent on  color Doppler imaging with normal direction  of blood flow towards the liver.   Other: None.   IMPRESSION:  1. There is cholelithiasis without secondary signs of acute  cholecystitis.  2. Hepatic steatosis    Electronically Signed   By: Katherine Mantle M.D.   On: 02/10/2021 22:47   Assessment:      Biliary colic [K80.50] H&P consistent  Plan:     1. Biliary colic [K80.50] Discussed the risk of surgery including post-op infxn, seroma, biloma, chronic pain, poor-delayed wound healing, retained gallstone, conversion to open procedure, post-op SBO or ileus, and need for additional procedures to address said risks.  The risks of general anesthetic including MI, CVA, sudden death or even reaction to anesthetic medications also discussed. Alternatives include continued observation.  Benefits include possible symptom relief, prevention of complications including acute cholecystitis, pancreatitis.  Typical post operative recovery of 3-5 days rest, continued pain in area and incision sites, possible loose stools up to 4-6 weeks, also discussed.  ED return precautions given for sudden increase in RUQ pain, with possible accompanying fever, nausea, and/or vomiting.  The patient understands the risks, any and all questions were answered to the patient's satisfaction.  2. Patient has elected to proceed with surgical treatment. Procedure will be scheduled.  Written consent was obtained.robotic assisted laparoscopic

## 2021-02-22 NOTE — H&P (View-Only) (Signed)
Subjective:   CC: Biliary colic [K80.50]  HPI:  Tom Branch is a 24 y.o. male who was referred by Vishwanath Handattur Ha* for evaluation of above CC. Symptoms were first noted a few days ago. Pain is sharp and intermittent, confined to the right upper quadrant, without radiation.  Associated with nausea, exacerbated by nothing specific.     Past Medical History:  has a past medical history of ADHD (attention deficit hyperactivity disorder), Asthma without status asthmaticus, unspecified, and Strep throat.  Past Surgical History:  has a past surgical history that includes Dental work at age 2; Tonsillectomy (12/15/2016); and Subcutaneous anterior transposition ulnar nerve, left elbow. (Left, 10/09/2018).  Family History: family history includes No Known Problems in his father; Stroke in his mother. He was adopted.  Social History:  reports that he has never smoked. He has never used smokeless tobacco. He reports that he does not drink alcohol and does not use drugs.  Current Medications: has a current medication list which includes the following prescription(s): albuterol, epinephrine, esomeprazole, ibuprofen, methylphenidate hcl, multivitamin, loratadine, melatonin, methylphenidate hcl, and naproxen sodium.  Allergies:  Allergies as of 02/22/2021 - Reviewed 02/22/2021  Allergen Reaction Noted  . Hydromorphone hcl Hives 06/09/2016  . Latex Hives 06/09/2016  . Other Other (See Comments) 06/02/2014    ROS:  A 15 point review of systems was performed and pertinent positives and negatives noted in HPI    Objective:     BP 123/79   Pulse 88   Ht 170.2 cm (5' 7")   Wt (!) 104.8 kg (231 lb)   BMI 36.18 kg/m    Constitutional :  alert, appears stated age, cooperative and no distress  Lymphatics/Throat:  no asymmetry, masses, or scars  Respiratory:  clear to auscultation bilaterally  Cardiovascular:  regular rate and rhythm  Gastrointestinal: soft, no guarding, but TTP in RUQ.     Musculoskeletal: Steady gait and movement  Skin: Cool and moist,   Psychiatric: Normal affect, non-agitated, not confused       LABS:  n/a  RADS: CLINICAL DATA: Abnormal LFTs   EXAM:  ULTRASOUND ABDOMEN LIMITED RIGHT UPPER QUADRANT   COMPARISON: June 09, 2016   FINDINGS:  Gallbladder:   There are gallstones without evidence for acute cholecystitis. There  is no gallbladder wall thickening orpericholecystic free fluid. The  sonographic Murphy sign is negative.   Common bile duct:   Diameter: 3 mm   Liver:   Diffuse increased echogenicity with slightly heterogeneous liver.  Appearance typically secondary to fatty infiltration. Fibrosis  secondary consideration. No secondary findings of cirrhosis noted.  No focal hepatic lesion or intrahepatic biliary duct dilatation.  Portal vein is patent on color Doppler imaging with normal direction  of blood flow towards the liver.   Other: None.   IMPRESSION:  1. There is cholelithiasis without secondary signs of acute  cholecystitis.  2. Hepatic steatosis    Electronically Signed  By: Christopher Green M.D.  On: 02/10/2021 22:47  Procedure Note  Green, Christopher S, MD - 02/10/2021  Formatting of this note might be different from the original.  CLINICAL DATA: Abnormal LFTs   EXAM:  ULTRASOUND ABDOMEN LIMITED RIGHT UPPER QUADRANT   COMPARISON: June 09, 2016   FINDINGS:  Gallbladder:   There are gallstones without evidence for acute cholecystitis. There  is no gallbladder wall thickening or pericholecystic free fluid. The  sonographic Murphy sign is negative.   Common bile duct:   Diameter: 3 mm   Liver:     Diffuse increased echogenicity with slightly heterogeneous liver.  Appearance typically secondary to fatty infiltration. Fibrosis  secondary consideration. No secondary findings of cirrhosis noted.  No focal hepatic lesion or intrahepatic biliary duct dilatation.  Portal vein is patent on  color Doppler imaging with normal direction  of blood flow towards the liver.   Other: None.   IMPRESSION:  1. There is cholelithiasis without secondary signs of acute  cholecystitis.  2. Hepatic steatosis    Electronically Signed   By: Katherine Mantle M.D.   On: 02/10/2021 22:47   Assessment:      Biliary colic [K80.50] H&P consistent  Plan:     1. Biliary colic [K80.50] Discussed the risk of surgery including post-op infxn, seroma, biloma, chronic pain, poor-delayed wound healing, retained gallstone, conversion to open procedure, post-op SBO or ileus, and need for additional procedures to address said risks.  The risks of general anesthetic including MI, CVA, sudden death or even reaction to anesthetic medications also discussed. Alternatives include continued observation.  Benefits include possible symptom relief, prevention of complications including acute cholecystitis, pancreatitis.  Typical post operative recovery of 3-5 days rest, continued pain in area and incision sites, possible loose stools up to 4-6 weeks, also discussed.  ED return precautions given for sudden increase in RUQ pain, with possible accompanying fever, nausea, and/or vomiting.  The patient understands the risks, any and all questions were answered to the patient's satisfaction.  2. Patient has elected to proceed with surgical treatment. Procedure will be scheduled.  Written consent was obtained.robotic assisted laparoscopic

## 2021-02-24 ENCOUNTER — Encounter
Admission: RE | Admit: 2021-02-24 | Discharge: 2021-02-24 | Disposition: A | Discharge status: Home / Self Care | Payer: 59 | Source: Ambulatory Visit | Attending: Surgery | Admitting: Surgery

## 2021-02-24 ENCOUNTER — Other Ambulatory Visit: Payer: Self-pay

## 2021-02-24 DIAGNOSIS — Z01818 Encounter for other preprocedural examination: Secondary | ICD-10-CM | POA: Insufficient documentation

## 2021-02-24 HISTORY — DX: Gastro-esophageal reflux disease without esophagitis: K21.9

## 2021-02-24 HISTORY — DX: Headache, unspecified: R51.9

## 2021-02-24 HISTORY — DX: Palpitations: R00.2

## 2021-02-24 HISTORY — DX: COVID-19: U07.1

## 2021-02-24 HISTORY — DX: Personal history of urinary calculi: Z87.442

## 2021-02-24 HISTORY — DX: Family history of other specified conditions: Z84.89

## 2021-02-24 NOTE — Patient Instructions (Signed)
Your procedure is scheduled on:03-02-21 WEDNESDAY Report to the Registration Desk on the 1st floor of the [facility]-Then proceed to the 2nd floor Surgery Desk in the [facility] To find out your arrival time, please call [phone] between 1PM - 3PM on:03-01-21 TUESDAY  REMEMBER: Instructions that are not followed completely may result in serious medical risk, up to and including death; or upon the discretion of your surgeon and anesthesiologist your surgery may need to be rescheduled.  Do not eat food after midnight the night before surgery.  No gum chewing, lozengers or hard candies.  You may however, drink CLEAR liquids up to 2 hours before you are scheduled to arrive for your surgery. Do not drink anything within 2 hours of your scheduled arrival time.  Clear liquids include: - water  - apple juice without pulp - gatorade - black coffee or tea (Do NOT add milk or creamers to the coffee or tea) Do NOT drink anything that is not on this list.  TAKE THESE MEDICATIONS THE MORNING OF SURGERY WITH A SIP OF WATER: -NEXIUM (ESOPMERAZOLE)-take one the night before and one on the morning of surgery - helps to prevent nausea after surgery.)  USE YOUR ALBUTEROL INHALER THE DAY OF SURGERY AND BRING INHALER TO THE HOSPITAL  One week prior to surgery: Stop Anti-inflammatories (NSAIDS) such as Advil, Aleve, Ibuprofen, Motrin, Naproxen, Naprosyn and Aspirin based products such as Excedrin, Goodys Powder, BC Powder-OK TO TAKE TYLENOL IF NEEDED  Stop ANY OVER THE COUNTER supplements until after surgery.  No Alcohol for 24 hours before or after surgery.  No Smoking including e-cigarettes for 24 hours prior to surgery.  No chewable tobacco products for at least 6 hours prior to surgery.  No nicotine patches on the day of surgery.  Do not use any "recreational" drugs for at least a week prior to your surgery.  Please be advised that the combination of cocaine and anesthesia may have negative  outcomes, up to and including death. If you test positive for cocaine, your surgery will be cancelled.  On the morning of surgery brush your teeth with toothpaste and water, you may rinse your mouth with mouthwash if you wish. Do not swallow any toothpaste or mouthwash.  Do not wear jewelry, make-up, hairpins, clips or nail polish.  Do not wear lotions, powders, or perfumes.   Do not shave body from the neck down 48 hours prior to surgery just in case you cut yourself which could leave a site for infection.  Also, freshly shaved skin may become irritated if using the CHG soap.  Contact lenses, hearing aids and dentures may not be worn into surgery.  Do not bring valuables to the hospital. [facility] is not responsible for any missing/lost belongings or valuables.   Use CHG Soap as directed on instruction sheet  Notify your doctor if there is any change in your medical condition (cold, fever, infection).  Wear comfortable clothing (specific to your surgery type) to the hospital.  Plan for stool softeners for home use; pain medications have a tendency to cause constipation. You can also help prevent constipation by eating foods high in fiber such as fruits and vegetables and drinking plenty of fluids as your diet allows.  After surgery, you can help prevent lung complications by doing breathing exercises.  Take deep breaths and cough every 1-2 hours. Your doctor may order a device called an Incentive Spirometer to help you take deep breaths. When coughing or sneezing, hold a  pillow firmly against your incision with both hands. This is called "splinting." Doing this helps protect your incision. It also decreases belly discomfort.  If you are being admitted to the hospital overnight, leave your suitcase in the car. After surgery it may be brought to your room.  If you are being discharged the day of surgery, you will not be allowed to drive home. You will need a responsible adult (18  years or older) to drive you home and stay with you that night.   If you are taking public transportation, you will need to have a responsible adult (18 years or older) with you. Please confirm with your physician that it is acceptable to use public transportation.   Please call the Pre-admissions Testing Dept. at [phone] if you have any questions about these instructions.  Surgery Visitation Policy:  Patients undergoing a surgery or procedure may have one family member or support person with them as long as that person is not COVID-19 positive or experiencing its symptoms.  That person may remain in the waiting area during the procedure.  Inpatient Visitation:    Visiting hours are 7 a.m. to 8 p.m. Inpatients will be allowed two visitors daily. The visitors may change each day during the patient's stay. No visitors under the age of 38. Any visitor under the age of 57 must be accompanied by an adult. The visitor must pass COVID-19 screenings, use hand sanitizer when entering and exiting the patient's room and wear a mask at all times, including in the patient's room. Patients must also wear a mask when staff or their visitor are in the room. Masking is required regardless of vaccination status.

## 2021-02-28 ENCOUNTER — Other Ambulatory Visit
Admission: RE | Admit: 2021-02-28 | Discharge: 2021-02-28 | Disposition: A | Discharge status: Home / Self Care | Payer: 59 | Source: Ambulatory Visit | Attending: Surgery | Admitting: Surgery

## 2021-02-28 ENCOUNTER — Other Ambulatory Visit: Payer: Self-pay

## 2021-02-28 DIAGNOSIS — Z01812 Encounter for preprocedural laboratory examination: Secondary | ICD-10-CM | POA: Insufficient documentation

## 2021-02-28 DIAGNOSIS — Z20822 Contact with and (suspected) exposure to covid-19: Secondary | ICD-10-CM | POA: Diagnosis not present

## 2021-02-28 LAB — SARS CORONAVIRUS 2 (TAT 6-24 HRS): SARS Coronavirus 2: NEGATIVE

## 2021-03-02 ENCOUNTER — Encounter
Admission: RE | Disposition: A | Discharge status: Home / Self Care | Payer: Self-pay | Source: Home / Self Care | Attending: Surgery

## 2021-03-02 ENCOUNTER — Ambulatory Visit: Payer: 59 | Admitting: Anesthesiology

## 2021-03-02 ENCOUNTER — Other Ambulatory Visit: Payer: Self-pay

## 2021-03-02 ENCOUNTER — Ambulatory Visit
Admission: RE | Admit: 2021-03-02 | Discharge: 2021-03-02 | Disposition: A | Discharge status: Home / Self Care | Payer: 59 | Attending: Surgery | Admitting: Surgery

## 2021-03-02 ENCOUNTER — Encounter: Payer: Self-pay | Admitting: Surgery

## 2021-03-02 DIAGNOSIS — Z885 Allergy status to narcotic agent status: Secondary | ICD-10-CM | POA: Diagnosis not present

## 2021-03-02 DIAGNOSIS — K810 Acute cholecystitis: Secondary | ICD-10-CM | POA: Diagnosis present

## 2021-03-02 DIAGNOSIS — Z91040 Latex allergy status: Secondary | ICD-10-CM | POA: Insufficient documentation

## 2021-03-02 DIAGNOSIS — Z791 Long term (current) use of non-steroidal anti-inflammatories (NSAID): Secondary | ICD-10-CM | POA: Insufficient documentation

## 2021-03-02 DIAGNOSIS — Z79899 Other long term (current) drug therapy: Secondary | ICD-10-CM | POA: Diagnosis not present

## 2021-03-02 DIAGNOSIS — Z8616 Personal history of COVID-19: Secondary | ICD-10-CM | POA: Diagnosis not present

## 2021-03-02 DIAGNOSIS — K8010 Calculus of gallbladder with chronic cholecystitis without obstruction: Secondary | ICD-10-CM | POA: Diagnosis not present

## 2021-03-02 DIAGNOSIS — F17200 Nicotine dependence, unspecified, uncomplicated: Secondary | ICD-10-CM | POA: Diagnosis not present

## 2021-03-02 HISTORY — DX: Depression, unspecified: F32.A

## 2021-03-02 HISTORY — DX: Anxiety disorder, unspecified: F41.9

## 2021-03-02 HISTORY — DX: Bipolar disorder, unspecified: F31.9

## 2021-03-02 SURGERY — CHOLECYSTECTOMY, ROBOT-ASSISTED, LAPAROSCOPIC
Anesthesia: General | Site: Abdomen

## 2021-03-02 MED ORDER — CHLORHEXIDINE GLUCONATE CLOTH 2 % EX PADS
6.0000 | MEDICATED_PAD | Freq: Once | CUTANEOUS | Status: AC
  Administered 2021-03-02: 6 via TOPICAL

## 2021-03-02 MED ORDER — LACTATED RINGERS IV SOLN: INTRAVENOUS | Status: DC

## 2021-03-02 MED ORDER — IBUPROFEN 800 MG PO TABS: 800.0000 mg | ORAL_TABLET | Freq: Three times a day (TID) | ORAL | 0 refills | Status: DC | PRN

## 2021-03-02 MED ORDER — CHLORHEXIDINE GLUCONATE 0.12 % MT SOLN
OROMUCOSAL | Status: AC
  Administered 2021-03-02: 15 mL via OROMUCOSAL
  Filled 2021-03-02: qty 15

## 2021-03-02 MED ORDER — FENTANYL CITRATE (PF) 100 MCG/2ML IJ SOLN
INTRAMUSCULAR | Status: AC
  Filled 2021-03-02: qty 2

## 2021-03-02 MED ORDER — ACETAMINOPHEN 10 MG/ML IV SOLN
INTRAVENOUS | Status: DC | PRN
  Administered 2021-03-02: 1000 mg via INTRAVENOUS

## 2021-03-02 MED ORDER — KETOROLAC TROMETHAMINE 30 MG/ML IJ SOLN
INTRAMUSCULAR | Status: DC | PRN
  Administered 2021-03-02: 30 mg via INTRAVENOUS

## 2021-03-02 MED ORDER — FENTANYL CITRATE (PF) 100 MCG/2ML IJ SOLN
INTRAMUSCULAR | Status: DC | PRN
  Administered 2021-03-02: 100 ug via INTRAVENOUS
  Administered 2021-03-02: 50 ug via INTRAVENOUS
  Administered 2021-03-02: 100 ug via INTRAVENOUS
  Administered 2021-03-02: 50 ug via INTRAVENOUS

## 2021-03-02 MED ORDER — METOCLOPRAMIDE HCL 5 MG/ML IJ SOLN
INTRAMUSCULAR | Status: AC
  Administered 2021-03-02: 10 mg
  Filled 2021-03-02: qty 2

## 2021-03-02 MED ORDER — TRAMADOL HCL 50 MG PO TABS: 50.0000 mg | ORAL_TABLET | Freq: Four times a day (QID) | ORAL | 0 refills | Status: DC | PRN

## 2021-03-02 MED ORDER — ROCURONIUM BROMIDE 100 MG/10ML IV SOLN
INTRAVENOUS | Status: DC | PRN
  Administered 2021-03-02: 80 mg via INTRAVENOUS
  Administered 2021-03-02: 20 mg via INTRAVENOUS

## 2021-03-02 MED ORDER — LIDOCAINE-EPINEPHRINE (PF) 1 %-1:200000 IJ SOLN
INTRAMUSCULAR | Status: AC
  Filled 2021-03-02: qty 30

## 2021-03-02 MED ORDER — ONDANSETRON HCL 4 MG/2ML IJ SOLN
INTRAMUSCULAR | Status: DC | PRN
  Administered 2021-03-02 (×2): 4 mg via INTRAVENOUS

## 2021-03-02 MED ORDER — MORPHINE SULFATE (PF) 4 MG/ML IV SOLN
INTRAVENOUS | Status: AC
  Administered 2021-03-02: 2 mg via INTRAVENOUS
  Filled 2021-03-02: qty 1

## 2021-03-02 MED ORDER — ACETAMINOPHEN 325 MG PO TABS: 650.0000 mg | ORAL_TABLET | Freq: Three times a day (TID) | ORAL | 0 refills | Status: AC | PRN

## 2021-03-02 MED ORDER — INDOCYANINE GREEN 25 MG IV SOLR
1.2500 mg | Freq: Once | INTRAVENOUS | Status: AC
  Administered 2021-03-02: 1.25 mg via INTRAVENOUS
  Filled 2021-03-02: qty 0.5

## 2021-03-02 MED ORDER — MORPHINE SULFATE (PF) 4 MG/ML IV SOLN: 2.0000 mg | INTRAVENOUS | Status: DC | PRN

## 2021-03-02 MED ORDER — DEXAMETHASONE SODIUM PHOSPHATE 10 MG/ML IJ SOLN
INTRAMUSCULAR | Status: DC | PRN
  Administered 2021-03-02: 10 mg via INTRAVENOUS

## 2021-03-02 MED ORDER — MIDAZOLAM HCL 2 MG/2ML IJ SOLN
INTRAMUSCULAR | Status: AC
  Filled 2021-03-02: qty 2

## 2021-03-02 MED ORDER — BUPIVACAINE HCL (PF) 0.5 % IJ SOLN
INTRAMUSCULAR | Status: AC
  Filled 2021-03-02: qty 30

## 2021-03-02 MED ORDER — PROPOFOL 10 MG/ML IV BOLUS
INTRAVENOUS | Status: DC | PRN
  Administered 2021-03-02: 200 mg via INTRAVENOUS

## 2021-03-02 MED ORDER — SUGAMMADEX SODIUM 200 MG/2ML IV SOLN
INTRAVENOUS | Status: DC | PRN
  Administered 2021-03-02: 200 mg via INTRAVENOUS

## 2021-03-02 MED ORDER — MIDAZOLAM HCL 2 MG/2ML IJ SOLN
INTRAMUSCULAR | Status: DC | PRN
  Administered 2021-03-02: 2 mg via INTRAVENOUS

## 2021-03-02 MED ORDER — SEVOFLURANE IN SOLN
RESPIRATORY_TRACT | Status: AC
  Filled 2021-03-02: qty 250

## 2021-03-02 MED ORDER — LIDOCAINE-EPINEPHRINE (PF) 1 %-1:200000 IJ SOLN
INTRAMUSCULAR | Status: DC | PRN
  Administered 2021-03-02: 16 mL via INTRAMUSCULAR

## 2021-03-02 MED ORDER — ACETAMINOPHEN 10 MG/ML IV SOLN
INTRAVENOUS | Status: AC
  Filled 2021-03-02: qty 100

## 2021-03-02 MED ORDER — CEFAZOLIN SODIUM-DEXTROSE 2-4 GM/100ML-% IV SOLN
INTRAVENOUS | Status: AC
  Filled 2021-03-02: qty 100

## 2021-03-02 MED ORDER — DOCUSATE SODIUM 100 MG PO CAPS: 100.0000 mg | ORAL_CAPSULE | Freq: Two times a day (BID) | ORAL | 0 refills | Status: AC | PRN

## 2021-03-02 MED ORDER — LIDOCAINE HCL (CARDIAC) PF 100 MG/5ML IV SOSY
PREFILLED_SYRINGE | INTRAVENOUS | Status: DC | PRN
  Administered 2021-03-02: 100 mg via INTRAVENOUS

## 2021-03-02 MED ORDER — CEFAZOLIN SODIUM-DEXTROSE 2-4 GM/100ML-% IV SOLN
2.0000 g | INTRAVENOUS | Status: AC
  Administered 2021-03-02: 2 g via INTRAVENOUS

## 2021-03-02 MED ORDER — CHLORHEXIDINE GLUCONATE 0.12 % MT SOLN: 15.0000 mL | Freq: Once | OROMUCOSAL | Status: AC

## 2021-03-02 MED ORDER — ONDANSETRON HCL 4 MG/2ML IJ SOLN: 4.0000 mg | Freq: Once | INTRAMUSCULAR | Status: DC | PRN

## 2021-03-02 MED ORDER — ORAL CARE MOUTH RINSE: 15.0000 mL | Freq: Once | OROMUCOSAL | Status: AC

## 2021-03-02 SURGICAL SUPPLY — 57 items
ADH SKN CLS APL DERMABOND .7 (GAUZE/BANDAGES/DRESSINGS) ×2
ANCHOR TIS RET SYS 235ML (MISCELLANEOUS) ×3 IMPLANT
APL PRP STRL LF DISP 70% ISPRP (MISCELLANEOUS) ×2
BAG INFUSER PRESSURE 100CC (MISCELLANEOUS) IMPLANT
BAG TISS RTRVL C235 10X14 (MISCELLANEOUS) ×2
BLADE SURG SZ11 CARB STEEL (BLADE) ×3 IMPLANT
CANISTER SUCT 1200ML W/VALVE (MISCELLANEOUS) ×3 IMPLANT
CANNULA REDUC XI 12-8 STAPL (CANNULA) ×1
CANNULA REDUCER 12-8 DVNC XI (CANNULA) ×2 IMPLANT
CATH REDDICK CHOLANGI 4FR 50CM (CATHETERS) IMPLANT
CHLORAPREP W/TINT 26 (MISCELLANEOUS) ×3 IMPLANT
CLIP VESOLOCK MED LG 6/CT (CLIP) ×6 IMPLANT
COVER TIP SHEARS 8 DVNC (MISCELLANEOUS) ×2 IMPLANT
COVER TIP SHEARS 8MM DA VINCI (MISCELLANEOUS) ×1
COVER WAND RF STERILE (DRAPES) ×3 IMPLANT
DECANTER SPIKE VIAL GLASS SM (MISCELLANEOUS) ×6 IMPLANT
DEFOGGER SCOPE WARMER CLEARIFY (MISCELLANEOUS) ×3 IMPLANT
DERMABOND ADVANCED (GAUZE/BANDAGES/DRESSINGS) ×1
DERMABOND ADVANCED .7 DNX12 (GAUZE/BANDAGES/DRESSINGS) ×2 IMPLANT
DRAPE ARM DVNC X/XI (DISPOSABLE) ×8 IMPLANT
DRAPE C-ARM XRAY 36X54 (DRAPES) IMPLANT
DRAPE COLUMN DVNC XI (DISPOSABLE) ×2 IMPLANT
DRAPE DA VINCI XI ARM (DISPOSABLE) ×4
DRAPE DA VINCI XI COLUMN (DISPOSABLE) ×1
ELECT CAUTERY BLADE 6.4 (BLADE) ×3 IMPLANT
ELECT REM PT RETURN 9FT ADLT (ELECTROSURGICAL) ×3
ELECTRODE REM PT RTRN 9FT ADLT (ELECTROSURGICAL) ×2 IMPLANT
GLOVE SURG SYN 6.5 ES PF (GLOVE) ×6 IMPLANT
GLOVE SURG UNDER POLY LF SZ7 (GLOVE) ×6 IMPLANT
GOWN STRL REUS W/ TWL LRG LVL3 (GOWN DISPOSABLE) ×6 IMPLANT
GOWN STRL REUS W/TWL LRG LVL3 (GOWN DISPOSABLE) ×9
GRASPER SUT TROCAR 14GX15 (MISCELLANEOUS) ×3 IMPLANT
IRRIGATOR SUCT 8 DISP DVNC XI (IRRIGATION / IRRIGATOR) IMPLANT
IRRIGATOR SUCTION 8MM XI DISP (IRRIGATION / IRRIGATOR)
IV NS 1000ML (IV SOLUTION)
IV NS 1000ML BAXH (IV SOLUTION) IMPLANT
LABEL OR SOLS (LABEL) ×3 IMPLANT
MANIFOLD NEPTUNE II (INSTRUMENTS) ×3 IMPLANT
NEEDLE HYPO 22GX1.5 SAFETY (NEEDLE) ×3 IMPLANT
NEEDLE INSUFFLATION 14GA 120MM (NEEDLE) ×3 IMPLANT
NS IRRIG 500ML POUR BTL (IV SOLUTION) ×3 IMPLANT
OBTURATOR OPTICAL STANDARD 8MM (TROCAR) ×1
OBTURATOR OPTICAL STND 8 DVNC (TROCAR) ×2
OBTURATOR OPTICALSTD 8 DVNC (TROCAR) ×2 IMPLANT
PACK LAP CHOLECYSTECTOMY (MISCELLANEOUS) ×3 IMPLANT
PENCIL ELECTRO HAND CTR (MISCELLANEOUS) ×3 IMPLANT
SEAL CANN UNIV 5-8 DVNC XI (MISCELLANEOUS) ×6 IMPLANT
SEAL XI 5MM-8MM UNIVERSAL (MISCELLANEOUS) ×3
SET TUBE SMOKE EVAC HIGH FLOW (TUBING) ×3 IMPLANT
SOLUTION ELECTROLUBE (MISCELLANEOUS) ×3 IMPLANT
STAPLER CANNULA SEAL DVNC XI (STAPLE) ×2 IMPLANT
STAPLER CANNULA SEAL XI (STAPLE) ×1
SUT MNCRL 4-0 (SUTURE) ×6
SUT MNCRL 4-0 27XMFL (SUTURE) ×4
SUT VICRYL 0 AB UR-6 (SUTURE) ×3 IMPLANT
SUTURE MNCRL 4-0 27XMF (SUTURE) ×4 IMPLANT
SYR 30ML LL (SYRINGE) IMPLANT

## 2021-03-02 NOTE — Op Note (Signed)
Preoperative diagnosis:  acute and cholecystitis  Postoperative diagnosis: same as above  Procedure: Robotic assisted Laparoscopic Cholecystectomy.   Anesthesia: GETA   Surgeon: Sung Amabile  Specimen: Gallbladder  Complications: None  EBL: 60mL  Wound Classification: Clean Contaminated  Indications: see HPI  Findings: Critical view of safety noted Cystic duct and artery identified, ligated and divided, clips remained intact at end of procedure Adequate hemostasis  Description of procedure:  The patient was placed on the operating table in the supine position. SCDs placed, pre-op abx administered.  General anesthesia was induced and OG tube placed by anesthesia. A time-out was completed verifying correct patient, procedure, site, positioning, and implant(s) and/or special equipment prior to beginning this procedure. The abdomen was prepped and draped in the usual sterile fashion.    Veress needle was placed at the Palmer's point and insufflation was started after confirming a positive saline drop test and no immediate increase in abdominal pressure.  After reaching 15 mm, the Veress needle was removed and a 8 mm port was placed via optiview technique above umbilicus measured 72mm from gallbladder.  The abdomen was inspected and no abnormalities or injuries were found.  Under direct vision, ports were placed in the following locations: One 12 mm patient left of the umbilicus, 8cm from the optiviewed port, one 8 mm port placed to the patient right of the umbilical port 8 cm apart.  1 additional 8 mm port placed lateral to the 8mm port.  Once ports were placed, The table was placed in the reverse Trendelenburg position with the right side up. The Xi platform was brought into the operative field and docked to the ports successfully.  An endoscope was placed through the umbilical port, fenestrated grasper through the adjacent patient right port, prograsp to the far patient left port, and then  a hook cautery in the left port.  The dome of the gallbladder was grasped with prograsp, passed and retracted over the dome of the liver. Adhesions between the gallbladder and omentum, duodenum and transverse colon were lysed via hook cautery. The infundibulum was grasped with the fenestrated grasper and retracted toward the right lower quadrant. This maneuver exposed Calot's triangle. The peritoneum overlying the gallbladder infundibulum was then dissected using electrocautery hook and the cystic duct and cystic artery identified.  Critical view of safety with the liver bed clearly visible behind the duct and artery with no additional structures noted.  The cystic duct and cystic artery clipped and divided close to the gallbladder.     The gallbladder was then dissected from its peritoneal and liver bed attachments by electrocautery. Hemostasis was checked prior to removing the hook cautery and the Endo Catch bag was then placed through the 12 mm port and the gallbladder was removed.  The gallbladder was passed off the table as a specimen. There was no evidence of bleeding from the gallbladder fossa or cystic artery or leakage of the bile from the cystic duct stump. The 12 mm port site closed with PMI using 0 vicryl under direct vision.  Abdomen desufflated and secondary trocars were removed under direct vision. No bleeding was noted. All skin incisions then closed with subcuticular sutures of 4-0 monocryl and dressed with topical skin adhesive. The orogastric tube was removed and patient extubated.  The patient tolerated the procedure well and was taken to the postanesthesia care unit in stable condition.  All sponge and instrument count correct at end of procedure.

## 2021-03-02 NOTE — Transfer of Care (Signed)
Immediate Anesthesia Transfer of Care Note  Patient: Tom Branch  Procedure(s) Performed: XI ROBOTIC ASSISTED LAPAROSCOPIC CHOLECYSTECTOMY (N/A Abdomen) INDOCYANINE GREEN FLUORESCENCE IMAGING (ICG)  Patient Location: PACU  Anesthesia Type:General  Level of Consciousness: awake, alert  and oriented  Airway & Oxygen Therapy: Patient Spontanous Breathing and Patient connected to face mask oxygen  Post-op Assessment: Report given to RN and Post -op Vital signs reviewed and stable  Post vital signs: Reviewed and stable  Last Vitals:  Vitals Value Taken Time  BP 127/89 03/02/21 1601  Temp    Pulse 107 03/02/21 1603  Resp 21 03/02/21 1603  SpO2 98 % 03/02/21 1603  Vitals shown include unvalidated device data.  Last Pain:  Vitals:   03/02/21 1241  TempSrc: Temporal  PainSc: 0-No pain         Complications: No complications documented.

## 2021-03-02 NOTE — Anesthesia Postprocedure Evaluation (Signed)
Anesthesia Post Note  Patient: Tom Branch  Procedure(s) Performed: XI ROBOTIC ASSISTED LAPAROSCOPIC CHOLECYSTECTOMY (N/A Abdomen) INDOCYANINE GREEN FLUORESCENCE IMAGING (ICG)  Patient location during evaluation: PACU Anesthesia Type: General Level of consciousness: awake and alert Pain management: pain level controlled Vital Signs Assessment: post-procedure vital signs reviewed and stable Respiratory status: spontaneous breathing, nonlabored ventilation, respiratory function stable and patient connected to nasal cannula oxygen Cardiovascular status: blood pressure returned to baseline and stable Postop Assessment: no apparent nausea or vomiting Anesthetic complications: no   No complications documented.   Last Vitals:  Vitals:   03/02/21 1645 03/02/21 1650  BP:  126/63  Pulse: 65 89  Resp:  18  Temp:  37 C  SpO2: 94% 97%    Last Pain:  Vitals:   03/02/21 1650  TempSrc: Tympanic  PainSc:                  Corinda Gubler

## 2021-03-02 NOTE — Anesthesia Preprocedure Evaluation (Addendum)
Anesthesia Evaluation  Patient identified by MRN, date of birth, ID band Patient awake    Reviewed: Allergy & Precautions, H&P , NPO status , Patient's Chart, lab work & pertinent test results  History of Anesthesia Complications Negative for: history of anesthetic complications  Airway Mallampati: II  TM Distance: >3 FB     Dental  (+) Teeth Intact   Pulmonary asthma , neg sleep apnea, neg COPD, Current Smoker,    breath sounds clear to auscultation       Cardiovascular (-) angina(-) Past MI and (-) Cardiac Stents negative cardio ROS  (-) dysrhythmias  Rhythm:regular Rate:Normal     Neuro/Psych  Headaches, PSYCHIATRIC DISORDERS Anxiety Depression Bipolar Disorder ADHD    GI/Hepatic Neg liver ROS, GERD  Controlled,  Endo/Other  negative endocrine ROS  Renal/GU      Musculoskeletal   Abdominal   Peds  Hematology negative hematology ROS (+)   Anesthesia Other Findings Past Medical History: No date: ADHD No date: Adopted     Comment:  no family medical hx No date: Asthma No date: COVID-19     Comment:  LAST HAD 12-2020 No date: Family history of adverse reaction to anesthesia     Comment:  pt is adopted and is unsure No date: GERD (gastroesophageal reflux disease) No date: Headache     Comment:  migraines No date: History of kidney stones No date: Palpitations     Comment:  H/O WITH COVID-NONE SINCE No date: Wears contact lenses  Past Surgical History: No date: LITHOTRIPSY 12/13/2016: TONSILLECTOMY; N/A     Comment:  Procedure: TONSILLECTOMY;  Surgeon: Bud Face,               MD;  Location: [facility];  Service: ENT;                Laterality: N/A; 10/09/2018: ULNAR NERVE TRANSPOSITION; Left     Comment:  Procedure: SUBCUTANEOUS TRANSPOSITION OF THE ULNAR NERVE              OF LEFT ELBOW;  Surgeon: Christena Flake, MD;  Location:               [facility];  Service: Orthopedics;   Laterality:               Left; No date: WISDOM TOOTH EXTRACTION  BMI    Body Mass Index: 34.46 kg/m      Reproductive/Obstetrics negative OB ROS                            Anesthesia Physical Anesthesia Plan  ASA: II  Anesthesia Plan: General ETT   Post-op Pain Management:    Induction:   PONV Risk Score and Plan: Ondansetron, Dexamethasone, Midazolam and Treatment may vary due to age or medical condition  Airway Management Planned:   Additional Equipment:   Intra-op Plan:   Post-operative Plan:   Informed Consent: I have reviewed the patients History and Physical, chart, labs and discussed the procedure including the risks, benefits and alternatives for the proposed anesthesia with the patient or authorized representative who has indicated his/her understanding and acceptance.     Dental Advisory Given  Plan Discussed with: Anesthesiologist, CRNA and Surgeon  Anesthesia Plan Comments:         Anesthesia Quick Evaluation

## 2021-03-02 NOTE — Anesthesia Procedure Notes (Signed)
Procedure Name: Intubation Date/Time: 03/02/2021 2:52 PM Performed by: Nelda Marseille, CRNA Pre-anesthesia Checklist: Patient identified, Patient being monitored, Timeout performed, Emergency Drugs available and Suction available Patient Re-evaluated:Patient Re-evaluated prior to induction Oxygen Delivery Method: Circle system utilized Preoxygenation: Pre-oxygenation with 100% oxygen Induction Type: IV induction Ventilation: Mask ventilation without difficulty Laryngoscope Size: Mac, 3 and McGraph Grade View: Grade I Tube type: Oral Tube size: 7.0 mm Number of attempts: 1 Airway Equipment and Method: Stylet Placement Confirmation: ETT inserted through vocal cords under direct vision,  positive ETCO2 and breath sounds checked- equal and bilateral Secured at: 21 cm Tube secured with: Tape Dental Injury: Teeth and Oropharynx as per pre-operative assessment

## 2021-03-02 NOTE — Discharge Instructions (Signed)
Laparoscopic Cholecystectomy, Care After This sheet gives you information about how to care for yourself after your procedure. Your doctor may also give you more specific instructions. If you have problems or questions, contact your doctor. Follow these instructions at home: Care for cuts from surgery (incisions)   Follow instructions from your doctor about how to take care of your cuts from surgery. Make sure you: ? Wash your hands with soap and water before you change your bandage (dressing). If you cannot use soap and water, use hand sanitizer. ? Change your bandage as told by your doctor. ? Leave stitches (sutures), skin glue, or skin tape (adhesive) strips in place. They may need to stay in place for 2 weeks or longer. If tape strips get loose and curl up, you may trim the loose edges. Do not remove tape strips completely unless your doctor says it is okay.  Do not take baths, swim, or use a hot tub until your doctor says it is okay. OK TO SHOWER 24HRS AFTER YOUR SURGERY.   Check your surgical cut area every day for signs of infection. Check for: ? More redness, swelling, or pain. ? More fluid or blood. ? Warmth. ? Pus or a bad smell. Activity  Do not drive or use heavy machinery while taking prescription pain medicine.  Do not play contact sports until your doctor says it is okay.  Do not drive for 24 hours if you were given a medicine to help you relax (sedative).  Rest as needed. Do not return to work or school until your doctor says it is okay. General instructions .  tylenol and advil as needed for discomfort.  Please alternate between the two every four hours as needed for pain.   .  Use narcotics, if prescribed, only when tylenol and motrin is not enough to control pain. .  325-650mg every 8hrs to max of 3000mg/24hrs (including the 325mg in every norco dose) for the tylenol.   .  Advil up to 800mg per dose every 8hrs as needed for pain.    To prevent or treat constipation  while you are taking prescription pain medicine, your doctor may recommend that you: ? Drink enough fluid to keep your pee (urine) clear or pale yellow. ? Take over-the-counter or prescription medicines. ? Eat foods that are high in fiber, such as fresh fruits and vegetables, whole grains, and beans. ? Limit foods that are high in fat and processed sugars, such as fried and sweet foods. Contact a doctor if:  You develop a rash.  You have more redness, swelling, or pain around your surgical cuts.  You have more fluid or blood coming from your surgical cuts.  Your surgical cuts feel warm to the touch.  You have pus or a bad smell coming from your surgical cuts.  You have a fever.  One or more of your surgical cuts breaks open. Get help right away if:  You have trouble breathing.  You have chest pain.  You have pain that is getting worse in your shoulders.  You faint or feel dizzy when you stand.  You have very bad pain in your belly (abdomen).  You are sick to your stomach (nauseous) for more than one day.  You have throwing up (vomiting) that lasts for more than one day.  You have leg pain. This information is not intended to replace advice given to you by your health care provider. Make sure you discuss any questions you have with your   health care provider. Document Released: 09/19/2008 Document Revised: 07/01/2016 Document Reviewed: 05/29/2016 Elsevier Interactive Patient Education  2019 [employer]   AMBULATORY SURGERY  DISCHARGE INSTRUCTIONS   1) The drugs that you were given will stay in your system until tomorrow so for the next 24 hours you should not:  A) Drive an automobile B) Make any legal decisions C) Drink any alcoholic beverage   2) You may resume regular meals tomorrow.  Today it is better to start with liquids and gradually work up to solid foods.  You may eat anything you prefer, but it is better to start with liquids, then soup and crackers,  and gradually work up to solid foods.   3) Please notify your doctor immediately if you have any unusual bleeding, trouble breathing, redness and pain at the surgery site, drainage, fever, or pain not relieved by medication.    4) Additional Instructions:        Please contact your physician with any problems or Same Day Surgery at [phone], Monday through Friday 6 am to 4 pm, or [place] [facility] [place] [facility] number at [phone]. 

## 2021-03-02 NOTE — Interval H&P Note (Signed)
History and Physical Interval Note:  03/02/2021 1:57 PM  [facility] Tom Branch  has presented today for surgery, with the diagnosis of K80.50 Biliary Colic.  The various methods of treatment have been discussed with the patient and family. After consideration of risks, benefits and other options for treatment, the patient has consented to  Procedure(s): XI ROBOTIC ASSISTED LAPAROSCOPIC CHOLECYSTECTOMY (N/A) as a surgical intervention.  The patient's history has been reviewed, patient examined, no change in status, stable for surgery.  I have reviewed the patient's chart and labs.  Questions were answered to the patient's satisfaction.     [name]

## 2021-03-04 LAB — SURGICAL PATHOLOGY

## 2021-06-14 ENCOUNTER — Emergency Department
Admission: EM | Admit: 2021-06-14 | Discharge: 2021-06-14 | Disposition: A | Discharge status: Home / Self Care | Payer: BC Managed Care – PPO | Attending: Emergency Medicine | Admitting: Emergency Medicine

## 2021-06-14 ENCOUNTER — Other Ambulatory Visit: Payer: Self-pay

## 2021-06-14 DIAGNOSIS — R5381 Other malaise: Secondary | ICD-10-CM | POA: Insufficient documentation

## 2021-06-14 DIAGNOSIS — Z20822 Contact with and (suspected) exposure to covid-19: Secondary | ICD-10-CM | POA: Insufficient documentation

## 2021-06-14 DIAGNOSIS — R519 Headache, unspecified: Secondary | ICD-10-CM | POA: Insufficient documentation

## 2021-06-14 DIAGNOSIS — Z91040 Latex allergy status: Secondary | ICD-10-CM | POA: Insufficient documentation

## 2021-06-14 DIAGNOSIS — R110 Nausea: Secondary | ICD-10-CM | POA: Insufficient documentation

## 2021-06-14 DIAGNOSIS — R5383 Other fatigue: Secondary | ICD-10-CM | POA: Insufficient documentation

## 2021-06-14 DIAGNOSIS — J45909 Unspecified asthma, uncomplicated: Secondary | ICD-10-CM | POA: Diagnosis not present

## 2021-06-14 DIAGNOSIS — F17290 Nicotine dependence, other tobacco product, uncomplicated: Secondary | ICD-10-CM | POA: Insufficient documentation

## 2021-06-14 DIAGNOSIS — R059 Cough, unspecified: Secondary | ICD-10-CM | POA: Diagnosis not present

## 2021-06-14 DIAGNOSIS — Z8616 Personal history of COVID-19: Secondary | ICD-10-CM | POA: Diagnosis not present

## 2021-06-14 DIAGNOSIS — R7989 Other specified abnormal findings of blood chemistry: Secondary | ICD-10-CM

## 2021-06-14 LAB — CBC WITH DIFFERENTIAL/PLATELET
Abs Immature Granulocytes: 0.02 10*3/uL (ref 0.00–0.07)
Basophils Absolute: 0 10*3/uL (ref 0.0–0.1)
Basophils Relative: 1 %
Eosinophils Absolute: 0.2 10*3/uL (ref 0.0–0.5)
Eosinophils Relative: 3 %
HCT: 43.7 % (ref 39.0–52.0)
Hemoglobin: 15 g/dL (ref 13.0–17.0)
Immature Granulocytes: 0 %
Lymphocytes Relative: 27 %
Lymphs Abs: 1.8 10*3/uL (ref 0.7–4.0)
MCH: 31.2 pg (ref 26.0–34.0)
MCHC: 34.3 g/dL (ref 30.0–36.0)
MCV: 90.9 fL (ref 80.0–100.0)
Monocytes Absolute: 1.1 10*3/uL — ABNORMAL HIGH (ref 0.1–1.0)
Monocytes Relative: 17 %
Neutro Abs: 3.6 10*3/uL (ref 1.7–7.7)
Neutrophils Relative %: 52 %
Platelets: 257 10*3/uL (ref 150–400)
RBC: 4.81 MIL/uL (ref 4.22–5.81)
RDW: 13.5 % (ref 11.5–15.5)
WBC: 6.8 10*3/uL (ref 4.0–10.5)
nRBC: 0 % (ref 0.0–0.2)

## 2021-06-14 LAB — URINALYSIS, COMPLETE (UACMP) WITH MICROSCOPIC
Bacteria, UA: NONE SEEN
Bilirubin Urine: NEGATIVE
Glucose, UA: NEGATIVE mg/dL
Hgb urine dipstick: NEGATIVE
Ketones, ur: NEGATIVE mg/dL
Leukocytes,Ua: NEGATIVE
Nitrite: NEGATIVE
Protein, ur: NEGATIVE mg/dL
Specific Gravity, Urine: 1.011 (ref 1.005–1.030)
Squamous Epithelial / HPF: NONE SEEN (ref 0–5)
pH: 7 (ref 5.0–8.0)

## 2021-06-14 LAB — COMPREHENSIVE METABOLIC PANEL
ALT: 116 U/L — ABNORMAL HIGH (ref 0–44)
AST: 62 U/L — ABNORMAL HIGH (ref 15–41)
Albumin: 4.3 g/dL (ref 3.5–5.0)
Alkaline Phosphatase: 95 U/L (ref 38–126)
Anion gap: 7 (ref 5–15)
BUN: 10 mg/dL (ref 6–20)
CO2: 28 mmol/L (ref 22–32)
Calcium: 8.6 mg/dL — ABNORMAL LOW (ref 8.9–10.3)
Chloride: 101 mmol/L (ref 98–111)
Creatinine, Ser: 1.16 mg/dL (ref 0.61–1.24)
GFR, Estimated: >60 mL/min (ref >60)
Glucose, Bld: 96 mg/dL (ref 70–99)
Potassium: 4.1 mmol/L (ref 3.5–5.1)
Sodium: 136 mmol/L (ref 135–145)
Total Bilirubin: 0.7 mg/dL (ref 0.3–1.2)
Total Protein: 7.5 g/dL (ref 6.5–8.1)

## 2021-06-14 LAB — RESP PANEL BY RT-PCR (FLU A&B, COVID) ARPGX2
Influenza A by PCR: NEGATIVE
Influenza B by PCR: NEGATIVE
SARS Coronavirus 2 by RT PCR: NEGATIVE

## 2021-06-14 LAB — GROUP A STREP BY PCR: Group A Strep by PCR: NOT DETECTED

## 2021-06-14 LAB — CK: Total CK: 161 U/L (ref 49–397)

## 2021-06-14 MED ORDER — SODIUM CHLORIDE 0.9 % IV BOLUS
1000.0000 mL | Freq: Once | INTRAVENOUS | Status: AC
  Administered 2021-06-14: 1000 mL via INTRAVENOUS

## 2021-06-14 MED ORDER — MECLIZINE HCL 50 MG PO TABS: 50.0000 mg | ORAL_TABLET | Freq: Three times a day (TID) | ORAL | 0 refills | Status: AC | PRN

## 2021-06-14 NOTE — Discharge Instructions (Addendum)
Please stay hydrated at home.  Drink at least 64 ounces of water a day. You can also drink sugar-free Gatorade. You can check your COVID-19 and influenza testing results through MyChart. Please make a follow-up appointment with your PCP in 1 week unless you feel completely better.

## 2021-06-14 NOTE — ED Triage Notes (Signed)
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

## 2021-06-14 NOTE — ED Provider Notes (Signed)
[facility]-EMERGENCY DEPARTMENT  ____________________________________________  Time seen: Approximately 3:48 PM  I have reviewed the triage vital signs and the nursing notes.   HISTORY  Chief Complaint Fatigue   Historian Patient     HPI Tom Branch is a 25 y.o. male presents to the emergency department with fatigue, malaise and headache over the past 2 days.  Patient reports that he is spent a significant amount of time outside in the heat over the past 2 days.  Patient states that he woke today feeling significantly worse despite drinking Pedialyte at home and resting.  He denies fever and chills.  He has had some nausea but no vomiting.  Denies diarrhea.  Patient states that he has strep throat frequently but denies pharyngitis.  States that he has sporadic cough and took a at home COVID test yesterday which was negative.  He denies chest pain or chest tightness.  No other alleviating measures have been attempted.   Past Medical History:  Diagnosis Date   ADHD    Adopted    no family medical hx   Anxiety    Asthma    Bipolar disorder (HCC)    two prior suicide attempts - one with overdose on acetaminophen, another with ''sliced veins''    COVID-19    LAST HAD 12-2020   Depression    ptsd   Family history of adverse reaction to anesthesia    pt is adopted and is unsure   GERD (gastroesophageal reflux disease)    Headache    migraines   History of kidney stones    Palpitations    H/O WITH COVID-NONE SINCE   Wears contact lenses      Immunizations up to date:  Yes.     Past Medical History:  Diagnosis Date   ADHD    Adopted    no family medical hx   Anxiety    Asthma    Bipolar disorder (HCC)    two prior suicide attempts - one with overdose on acetaminophen, another with ''sliced veins''    COVID-19    LAST HAD 12-2020   Depression    ptsd   Family history of adverse reaction to anesthesia    pt is adopted and is unsure   GERD  (gastroesophageal reflux disease)    Headache    migraines   History of kidney stones    Palpitations    H/O WITH COVID-NONE SINCE   Wears contact lenses     Patient Active Problem List   Diagnosis Date Noted   Attention deficit hyperactivity disorder (ADHD) 12/13/2018   Severe recurrent major depression without psychotic features (HCC)    PTSD (post-traumatic stress disorder) 12/11/2018   OCD (obsessive compulsive disorder) 12/11/2018   Self-inflicted laceration of wrist (HCC) 12/10/2018   Bipolar I disorder, most recent episode depressed, severe without psychotic features (HCC) 12/10/2018    Past Surgical History:  Procedure Laterality Date   LITHOTRIPSY     TONSILLECTOMY N/A 12/13/2016   Procedure: TONSILLECTOMY;  Surgeon: Bud Face, MD;  Location: [facility];  Service: ENT;  Laterality: N/A;   ULNAR NERVE TRANSPOSITION Left 10/09/2018   Procedure: SUBCUTANEOUS TRANSPOSITION OF THE ULNAR NERVE OF LEFT ELBOW;  Surgeon: Christena Flake, MD;  Location: [facility];  Service: Orthopedics;  Laterality: Left;   WISDOM TOOTH EXTRACTION      Prior to Admission medications   Medication Sig Start Date End Date Taking? Authorizing Provider  albuterol (PROVENTIL HFA;VENTOLIN HFA) 108 (  90 Base) MCG/ACT inhaler Inhale 2 puffs into the lungs every 6 (six) hours as needed for wheezing or shortness of breath.    [provider]  dicyclomine (BENTYL) 10 MG capsule Take 1 capsule (10 mg total) by mouth 4 (four) times daily for 14 days. 01/09/20 01/23/20  Orvil Feil, PA-C  EPINEPHrine (EPIPEN 2-PAK) 0.3 mg/0.3 mL IJ SOAJ injection Inject 0.3 mLs (0.3 mg total) into the muscle once. 02/09/16   Rebecka Apley, MD  esomeprazole (NEXIUM) 20 MG capsule Take 20 mg by mouth daily as needed for heartburn. 01/12/21   [provider]  ibuprofen (ADVIL) 800 MG tablet Take 1 tablet (800 mg total) by mouth every 8 (eight) hours as needed for mild pain or moderate  pain. 03/02/21   Sung Amabile, DO  methylphenidate 36 MG PO CR tablet Take 36 mg by mouth daily as needed (ADHD).    [provider]  traMADol (ULTRAM) 50 MG tablet Take 1 tablet (50 mg total) by mouth every 6 (six) hours as needed for up to 6 doses for severe pain. 03/02/21   Sung Amabile, DO    Allergies Dilaudid [hydromorphone hcl], Hydroxyprogesterone, Latex, Meat extract, Lactose intolerance (gi), and Other  No family history on file.  Social History Social History   Tobacco Use   Smoking status: Some Days    Pack years: 0.00    Types: Cigars   Smokeless tobacco: Current    Types: Chew   Tobacco comments:    1-2 cigars per week  Vaping Use   Vaping Use: Never used  Substance Use Topics   Alcohol use: Yes    Comment: occ wine   Drug use: No     Review of Systems  Constitutional: No fever/chills Eyes:  No discharge ENT: No upper respiratory complaints. Respiratory: no cough. No SOB/ use of accessory muscles to breath Gastrointestinal:   No nausea, no vomiting.  No diarrhea.  No constipation. Musculoskeletal: Patient has myalgias. Skin: Negative for rash, abrasions, lacerations, ecchymosis.    ____________________________________________   PHYSICAL EXAM:  VITAL SIGNS: ED Triage Vitals  Enc Vitals Group     BP 06/14/21 1430 (!) 141/95     Pulse Rate 06/14/21 1430 98     Resp 06/14/21 1430 18     Temp 06/14/21 1430 98 F (36.7 C)     Temp Source 06/14/21 1430 Oral     SpO2 06/14/21 1430 98 %     Weight 06/14/21 1431 230 lb (104.3 kg)     Height 06/14/21 1431 5\' 8"  (1.727 m)     Head Circumference --      Peak Flow --      Pain Score 06/14/21 1431 6     Pain Loc --      Pain Edu? --      Excl. in GC? --      Constitutional: Alert and oriented. Well appearing and in no acute distress. Eyes: Conjunctivae are normal. PERRL. EOMI. Head: Atraumatic. ENT:      Nose: No congestion/rhinnorhea.      Mouth/Throat: Mucous membranes are moist.  Neck:  No stridor.  No cervical spine tenderness to palpation. Cardiovascular: Normal rate, regular rhythm. Normal S1 and S2.  Good peripheral circulation. Respiratory: Normal respiratory effort without tachypnea or retractions. Lungs CTAB. Good air entry to the bases with no decreased or absent breath sounds Gastrointestinal: Bowel sounds x 4 quadrants. Soft and nontender to palpation. No guarding or rigidity. No distention. Musculoskeletal:  Full range of motion to all extremities. No obvious deformities noted Neurologic:  Normal for age. No gross focal neurologic deficits are appreciated.  Skin:  Skin is warm, dry and intact. No rash noted. Psychiatric: Mood and affect are normal for age. Speech and behavior are normal.   ____________________________________________   LABS (all labs ordered are listed, but only abnormal results are displayed)  Labs Reviewed  RESP PANEL BY RT-PCR (FLU A&B, COVID) ARPGX2  GROUP A STREP BY PCR  CBC WITH DIFFERENTIAL/PLATELET  COMPREHENSIVE METABOLIC PANEL  CK  URINALYSIS, COMPLETE (UACMP) WITH MICROSCOPIC   ____________________________________________  EKG   ____________________________________________  RADIOLOGY   No results found.  ____________________________________________    PROCEDURES  Procedure(s) performed:     Procedures     Medications  sodium chloride 0.9 % bolus 1,000 mL (has no administration in time range)     ____________________________________________   INITIAL IMPRESSION / ASSESSMENT AND PLAN / ED COURSE  Pertinent labs & imaging results that were available during my care of the patient were reviewed by me and considered in my medical decision making (see chart for details).       Assessment and plan Malaise:  25 year old male presents to the emergency department with fatigue and weakness over the past 2 to 3 days.  Patient's vital signs are reassuring at triage.  On physical exam, patient was alert,  active and nontoxic-appearing.  He was able to ambulate easily to the restroom in the emergency department.  Differential diagnosis includes dehydration, rhabdo, AKI, COVID-19, group A strep, unspecified viral URI, symptomatic anemia...  CBC was reassuring.  AST and ALT were elevated at 62 and 116 respectively.  I reviewed patient's prior labs and patient has not had elevated liver enzymes in the past.  Patient denies recent alcohol use.  Hepatitis panel is in process at this time.  Patient tested negative for COVID-19, influenza and group A strep.  Patient was advised to stay hydrated at home and was discharged with a short course of meclizine for dizziness.  Return precautions were given to return with new or worsening symptoms.  All patient questions were answered.  ____________________________________________  FINAL CLINICAL IMPRESSION(S) / ED DIAGNOSES  Final diagnoses:  None      NEW MEDICATIONS STARTED DURING THIS VISIT:  ED Discharge Orders     None           This chart was dictated using voice recognition software/Dragon. Despite best efforts to proofread, errors can occur which can change the meaning. Any change was purely unintentional.     Orvil Feil, PA-C 06/14/21 Leveda Anna, MD 06/15/21 [phone]

## 2021-10-27 ENCOUNTER — Other Ambulatory Visit: Payer: Self-pay

## 2021-10-27 ENCOUNTER — Encounter: Payer: Self-pay | Admitting: *Deleted

## 2021-10-27 ENCOUNTER — Emergency Department: Payer: Worker's Compensation

## 2021-10-27 ENCOUNTER — Emergency Department
Admission: EM | Admit: 2021-10-27 | Discharge: 2021-10-28 | Disposition: A | Discharge status: Home / Self Care | Payer: Worker's Compensation | Attending: Emergency Medicine | Admitting: Emergency Medicine

## 2021-10-27 DIAGNOSIS — R110 Nausea: Secondary | ICD-10-CM | POA: Diagnosis not present

## 2021-10-27 DIAGNOSIS — Z8679 Personal history of other diseases of the circulatory system: Secondary | ICD-10-CM | POA: Diagnosis not present

## 2021-10-27 DIAGNOSIS — J45909 Unspecified asthma, uncomplicated: Secondary | ICD-10-CM | POA: Insufficient documentation

## 2021-10-27 DIAGNOSIS — F17220 Nicotine dependence, chewing tobacco, uncomplicated: Secondary | ICD-10-CM | POA: Diagnosis not present

## 2021-10-27 DIAGNOSIS — Z77098 Contact with and (suspected) exposure to other hazardous, chiefly nonmedicinal, chemicals: Secondary | ICD-10-CM

## 2021-10-27 DIAGNOSIS — Z91040 Latex allergy status: Secondary | ICD-10-CM | POA: Insufficient documentation

## 2021-10-27 DIAGNOSIS — R0602 Shortness of breath: Secondary | ICD-10-CM | POA: Insufficient documentation

## 2021-10-27 DIAGNOSIS — T542X4A Toxic effect of corrosive acids and acid-like substances, undetermined, initial encounter: Secondary | ICD-10-CM | POA: Diagnosis present

## 2021-10-27 DIAGNOSIS — X58XXXA Exposure to other specified factors, initial encounter: Secondary | ICD-10-CM | POA: Insufficient documentation
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
## 2021-10-28 ENCOUNTER — Other Ambulatory Visit: Payer: Self-pay
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
## 2021-10-28 NOTE — Discharge Instructions (Signed)
Please return to the emergency department if you begin having shortness of breath, wheezing, abdominal pain, vomiting, burning of the skin, rash or other symptoms concerning to you.  Your vital signs including your oxygen level have been normal.  Your chest x-ray was clear and your EKG showed no new changes.

## 2022-03-09 NOTE — Progress Notes (Deleted)
Psychiatric Initial Adult Assessment  ? ?Patient Identification: [facility] ?MRN:  [id number] ?Date of Evaluation:  03/09/2022 ?Referral Source: *** ?Chief Complaint:  No chief complaint on file. ? ?Visit Diagnosis: No diagnosis found. ? ?History of Present Illness:   ?[facility] is a 26 y.o. year old male with a history of adhd, depression, anxiety, bipolar, abnormal LFT, asthma, who is referred for bipolar disorder.  ? ?[place] overdose ? ?Daily routine: ?Diet:  ?Exercise: ?Support: ?Household:  ?Marital status: ?Number of children: ?Employment:  ?Education:   ?Last PCP / ongoing medical evaluation:   ? ? ? ? ?  ? ?Associated Signs/Symptoms: ?Depression Symptoms:  {DEPRESSION SYMPTOMS:20000} ?(Hypo) Manic Symptoms:  {BHH MANIC SYMPTOMS:22872} ?Anxiety Symptoms:  {BHH ANXIETY SYMPTOMS:22873} ?Psychotic Symptoms:  {BHH PSYCHOTIC SYMPTOMS:22874} ?PTSD Symptoms: ?{BHH PTSD SYMPTOMS:22875} ? ?Past Psychiatric History:  ?Outpatient:  ?Psychiatry admission:  ?Previous suicide attempt:  ?Past trials of medication:  ?History of violence:   ? ?Previous Psychotropic Medications: {YES/NO:21197} ? ?Substance Abuse History in the last 12 months:  {yes no:314532} ? ?Consequences of Substance Abuse: ?{BHH CONSEQUENCES OF SUBSTANCE ABUSE:22880} ? ?Past Medical History:  ?Past Medical History:  ?Diagnosis Date  ? ADHD   ? Adopted   ? no family medical hx  ? Anxiety   ? Asthma   ? Bipolar disorder ([place])   ? two prior suicide attempts - one with overdose on acetaminophen, another with ''sliced veins''   ? COVID-19   ? LAST HAD 12-2020  ? Depression   ? ptsd  ? Family history of adverse reaction to anesthesia   ? pt is adopted and is unsure  ? GERD (gastroesophageal reflux disease)   ? Headache   ? migraines  ? History of kidney stones   ? Palpitations   ? H/O WITH COVID-NONE SINCE  ? Wears contact lenses   ?  ?Past Surgical History:  ?Procedure Laterality Date  ? LITHOTRIPSY    ? TONSILLECTOMY  N/A 12/13/2016  ? Procedure: TONSILLECTOMY;  Surgeon: Carloyn Manner, MD;  Location: [place];  Service: ENT;  Laterality: N/A;  ? ULNAR NERVE TRANSPOSITION Left 10/09/2018  ? Procedure: SUBCUTANEOUS TRANSPOSITION OF THE ULNAR NERVE OF LEFT ELBOW;  Surgeon: Corky Mull, MD;  Location: [place];  Service: Orthopedics;  Laterality: Left;  ? WISDOM TOOTH EXTRACTION    ? ? ?Family Psychiatric History: *** ? ?Family History: No family history on file. ? ?Social History:   ?Social History  ? ?Socioeconomic History  ? Marital status: Single  ?  Spouse name: Not on file  ? Number of children: Not on file  ? Years of education: Not on file  ? Highest education level: Not on file  ?Occupational History  ? Not on file  ?Tobacco Use  ? Smoking status: Some Days  ?  Types: Cigars  ? Smokeless tobacco: Current  ?  Types: Chew  ? Tobacco comments:  ?  1-2 cigars per week  ?Vaping Use  ? Vaping Use: Never used  ?Substance and Sexual Activity  ? Alcohol use: Yes  ?  Comment: occ wine  ? Drug use: No  ? Sexual activity: Not on file  ?Other Topics Concern  ? Not on file  ?Social History Narrative  ? Not on file  ? ?Social Determinants of Health  ? ?Financial Resource Strain: Not on file  ?Food Insecurity: Not on file  ?Transportation Needs: Not on file  ?Physical Activity: Not on file  ?Stress: Not  on file  ?Social Connections: Not on file  ? ? ?Additional Social History: *** ? ?Allergies:   ?Allergies  ?Allergen Reactions  ? Dilaudid [Hydromorphone Hcl] Hives  ? Hydroxyprogesterone Anaphylaxis  ? Latex Hives  ?  Gloves (when worn)  ? Meat Extract Anaphylaxis  ?  Red meat-ALPHAGAL ALLERGY  ? Lactose Intolerance (Gi) Other (See Comments)  ?  Gi upset  ? Other Other (See Comments)  ?  Bleach, Neosporin: Rash  ? ? ?Metabolic Disorder Labs: ?Lab Results  ?Component Value Date  ? HGBA1C 4.8 12/11/2018  ? MPG 91.06 12/11/2018  ? ?No results found for: PROLACTIN ?Lab Results  ?Component Value Date  ? CHOL 172  12/11/2018  ? TRIG 137 12/11/2018  ? HDL 33 (L) 12/11/2018  ? CHOLHDL 5.2 12/11/2018  ? VLDL 27 12/11/2018  ? LDLCALC 112 (H) 12/11/2018  ? ?Lab Results  ?Component Value Date  ? TSH 1.043 12/11/2018  ? ? ?Therapeutic Level Labs: ?No results found for: LITHIUM ?No results found for: CBMZ ?No results found for: VALPROATE ? ?Current Medications: ?Current Outpatient Medications  ?Medication Sig Dispense Refill  ? albuterol (PROVENTIL HFA;VENTOLIN HFA) 108 (90 Base) MCG/ACT inhaler Inhale 2 puffs into the lungs every 6 (six) hours as needed for wheezing or shortness of breath.    ? dicyclomine (BENTYL) 10 MG capsule Take 1 capsule (10 mg total) by mouth 4 (four) times daily for 14 days. 56 capsule 0  ? EPINEPHrine (EPIPEN 2-PAK) 0.3 mg/0.3 mL IJ SOAJ injection Inject 0.3 mLs (0.3 mg total) into the muscle once. 1 Device 0  ? esomeprazole (NEXIUM) 20 MG capsule Take 20 mg by mouth daily as needed for heartburn.    ? ibuprofen (ADVIL) 800 MG tablet Take 1 tablet (800 mg total) by mouth every 8 (eight) hours as needed for mild pain or moderate pain. 30 tablet 0  ? methylphenidate 36 MG PO CR tablet Take 36 mg by mouth daily as needed (ADHD).    ? traMADol (ULTRAM) 50 MG tablet Take 1 tablet (50 mg total) by mouth every 6 (six) hours as needed for up to 6 doses for severe pain. 6 tablet 0  ? ?No current facility-administered medications for this visit.  ? ? ?Musculoskeletal: ?Strength & Muscle Tone: within normal limits ?Gait & Station: normal ?Patient leans: N/A ? ?Psychiatric Specialty Exam: ?Review of Systems  ?There were no vitals taken for this visit.There is no height or weight on file to calculate BMI.  ?General Appearance: {Appearance:22683}  ?Eye Contact:  {BHH EYE CONTACT:22684}  ?Speech:  Clear and Coherent  ?Volume:  Normal  ?Mood:  {BHH MOOD:22306}  ?Affect:  {Affect (PAA):22687}  ?Thought Process:  Coherent  ?Orientation:  Full (Time, Place, and Person)  ?Thought Content:  Logical  ?Suicidal Thoughts:  {ST/HT  (PAA):22692}  ?Homicidal Thoughts:  {ST/HT (PAA):22692}  ?Memory:  Immediate;   Good  ?Judgement:  {Judgement (PAA):22694}  ?Insight:  {Insight (PAA):22695}  ?Psychomotor Activity:  Normal  ?Concentration:  Concentration: Good and Attention Span: Good  ?Recall:  Good  ?Fund of [place]  ?Language: Good  ?Akathisia:  No  ?Handed:  Right  ?AIMS (if indicated):  not done  ?Assets:  Communication Skills ?Desire for Improvement  ?ADL's:  Intact  ?Cognition: WNL  ?Sleep:  {BHH GOOD/FAIR/POOR:22877}  ? ?Screenings: ?AIMS   ? ?Flowsheet Row Admission (Discharged) from 12/10/2018 in [place]  ?AIMS Total Score 0  ? ?  ? ?AUDIT   ? ?[place] Admission (Discharged)  from 12/10/2018 in [place]  ?Alcohol Use Disorder Identification Test Final Score (AUDIT) 1  ? ?  ? ?[place] ED from 10/27/2021 in [place] [facility] from 06/14/2021 in [place] Admission (Discharged) from 03/02/2021 in [place]  ?C-SSRS RISK CATEGORY No Risk No Risk No Risk  ? ?  ? ? ?Assessment and Plan:  ? ? ?Plan ? ? ?The patient demonstrates the following risk factors for suicide: Chronic risk factors for suicide include: {Chronic Risk Factors for HD:3327074. Acute risk factors for suicide include: {Acute Risk Factors for NL:6244280. Protective factors for this patient include: {Protective Factors for Suicide FR:7288263. Considering these factors, the overall suicide risk at this point appears to be {Desc; low/moderate/high:110033}. Patient {ACTION; IS/IS GI:087931 appropriate for outpatient follow up.  ? ?Collaboration of Care: {BH OP Collaboration of Care:21014065} ? ?Patient/Guardian was advised Release of Information must be obtained prior to any record release in order to collaborate their care with an outside provider. Patient/Guardian was  advised if they have not already done so to contact the registration department to sign all necessary forms in order for [place] to release information regarding their care.  ? ?Consent: Patient/Guardian gives verba

## 2022-03-14 ENCOUNTER — Ambulatory Visit: Payer: Self-pay | Admitting: Psychiatry

## 2022-03-15 ENCOUNTER — Ambulatory Visit: Payer: Self-pay | Admitting: Psychiatry

## 2022-04-24 NOTE — Progress Notes (Signed)
Virtual Visit via Video Note ? ?[facility] on 04/26/22 at  9:00 AM EDT by a video enabled telemedicine application and verified that I am speaking with the correct person using two identifiers. ? ?Location: ?Patient: home ?Provider: office ?Persons participated in the visit- patient, provider  ?  ?I discussed the limitations of evaluation and management by telemedicine and the availability of in person appointments. The patient expressed understanding and agreed to proceed. ?  ?I discussed the assessment and treatment plan with the patient. The patient was provided an opportunity to ask questions and all were answered. The patient agreed with the plan and demonstrated an understanding of the instructions. ?  ?The patient was advised to call back or seek an in-person evaluation if the symptoms worsen or if the condition fails to improve as anticipated. ? ?I provided 40 minutes of non-face-to-face time during this encounter. ? ? ?Tom Hottereina Nicha Hemann, MD ? ? ? ? ? Psychiatric Initial Adult Assessment  ? ?[name] ?MRN:  [id number] ?Date of Evaluation:  04/26/2022 ?Referral Source: Barbette ReichmannHande, Vishwanath, MD  ?Chief Complaint:   ?Chief Complaint  ?Patient presents with  ? Establish Care  ? Trauma  ? ?Visit Diagnosis:  ?  ICD-10-CM   ?1. Bipolar disorder, in partial remission, most recent episode depressed (HCC)  F31.75   ?  ?2. PTSD (post-traumatic stress disorder)  F43.10   ?  ? ? ?History of Present Illness:   ?Tom Branch is a 26 y.o. year old male with a history of bipolar disorder, PTSD, ADHD,  abnormal LFT, asthma, who is referred for bipolar disorder.  ? ? ?- Per chart review, he [facility] after suicide attempt.  Although it was unclear how he was diagnosed with bipolar I disorder (in the note, depression was also listed as diagnosis), there is a documentation of him "Smiles al the time, is restless, hyper.  Speech is loud and pressured." ? ?He states that he wishes to be back on his medication for his "problem".  He left [employer] in [employer]. He was very stressed around that time, thinking that his career is gone.  He  had a few suicide attempts, which led to admission.  Although he was doing good on medication, he could not see any psychiatrist since he moved to [place]. He and his wife is hoping to have a child, and he is trying to be prepared for this.  He states that he has PTSD.  He talks about an episode of his friend getting blown up with grenade explosion.  He has nightmares, flashback about this.  He avoids fireworks as it reminds him of the past.  He believes he has come out more aggressive since leaving the [employer].  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  He talks about an episode of him yelling at his coworker on the phone when he received a call at night.  He has never physically been violent to others as he knows that he cannot stop it if it happens.  He enjoys the work and loves his boss.  He reports better relationship with his wife.  ? ? ?Depression- he reports feeling depressed in the setting of his wife having leep procedure. He denies feeling depressed otherwise. He adamantly denies any recent SI, stating that he now sees a lot  of positives in his life, including having a new kitten.  ? ?Bipolar-he denies decreased need for sleep.  He has occasional euphonia with increased energy.  Although he reports impulsive shopping including maxing out credit card, it is always within his budget.  ? ?Substance- He drinks glass of wine once a week, Cbd gummy every night for sleep  ? 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 He was in [employer] for 6.5 months.  ?Education:  high  school ?Last PCP / ongoing medical evaluation:   ?[place] was [place], grew up in [place]. He was adopted.  ? ?Associated Signs/Symptoms: ?Depression Symptoms:  depressed mood, ?insomnia, ?difficulty concentrating, ?(Hypo) Manic Symptoms:  Impulsivity, ?Irritable Mood, ?Labiality of Mood, ?Denies decreased need for sleep ?Anxiety Symptoms:   denies ?Psychotic Symptoms:   denies AH, VH, paranoia  except hearing his grandmother ?PTSD Symptoms: ?Had a traumatic exposure:  lost his friend from grenade explosion ?Re-experiencing:  Flashbacks ?Intrusive Thoughts ?Nightmares ?Hypervigilance:  Yes ?Hyperarousal:  Difficulty Concentrating ?Increased Startle Response ?Irritability/Anger ?Avoidance:  Decreased Interest/Participation ? ?Past Psychiatric History:  ?Outpatient: diagnosed with bipolar disorder in 2019. ADHD since around age 22, and has been on Concerta ?Psychiatry admission: [facility] after overdosing Tylenol, [facility] after superficial cutting with a steak knife in 2019 ?Previous suicide attempt: three times (trying to hang himself when he was in [employer], overdose, superficial cutting) ?Past trials of medication: sertraline, Abilify (helped) ?History of violence:  denies  ? ?Previous Psychotropic Medications: Yes  ? ?Substance Abuse History in the last 12 months:  Yes.   ? ?Consequences of Substance Abuse: ?Moood symptoms as above ? ?Past Medical History:  ?Past Medical History:  ?Diagnosis Date  ? ADHD   ? Adopted   ? no family medical hx  ? Anxiety   ? Asthma   ? Bipolar disorder (HCC)   ? two prior suicide attempts - one with overdose on acetaminophen, another with ''sliced veins''   ? COVID-19   ? LAST HAD 12-2020  ? Depression   ? ptsd  ? Family history of adverse reaction to anesthesia   ? pt is adopted and is unsure  ? GERD (gastroesophageal reflux disease)   ? Headache   ? migraines  ? History of kidney stones   ? Palpitations   ? H/O WITH COVID-NONE SINCE  ? Wears contact lenses   ?  ?Past  Surgical History:  ?Procedure Laterality Date  ? LITHOTRIPSY    ? TONSILLECTOMY N/A 12/13/2016  ? Procedure: TONSILLECTOMY;  Surgeon: Bud Face, MD;  [facility];  Service: ENT;  Laterality: N/A;  ? ULNAR NERVE TRANSPOSITION Left 10/09/2018  ? Procedure: SUBCUTANEOUS TRANSPOSITION OF THE ULNAR NERVE OF LEFT ELBOW;  Surgeon: Christena Flake, MD;  Location: [facility];  Service: Orthopedics;  Laterality: Left;  ? WISDOM TOOTH EXTRACTION    ? ? ?Family Psychiatric History: as below ? ?Family History:  ?Family History  ?Adopted: Yes  ?Problem Relation Age of Onset  ? Drug abuse Mother   ? Alcohol abuse Maternal Grandfather   ? Alcohol abuse Maternal Grandmother   ? ? ?Social History:   ?Social History  ? ?Socioeconomic History  ? Marital status: Single  ?  Spouse name: Not on file  ? Number of children: Not on file  ? Years of education: Not on file  ? Highest education level:  Not on file  ?Occupational History  ? Not on file  ?Tobacco Use  ? Smoking status: Some Days  ?  Types: Cigars  ? Smokeless tobacco: Current  ?  Types: Chew  ? Tobacco comments:  ?  1-2 cigars per week  ?Vaping Use  ? Vaping Use: Never used  ?Substance and Sexual Activity  ? Alcohol use: Yes  ?  Comment: occ wine  ? Drug use: No  ? Sexual activity: Not on file  ?Other Topics Concern  ? Not on file  ?Social History Narrative  ? Not on file  ? ?Social Determinants of Health  ? ?Financial Resource Strain: Not on file  ?Food Insecurity: Not on file  ?Transportation Needs: Not on file  ?Physical Activity: Not on file  ?Stress: Not on file  ?Social Connections: Not on file  ? ? ?Additional Social History: as above ? ?Allergies:   ?Allergies  ?Allergen Reactions  ? Dilaudid [Hydromorphone Hcl] Hives  ? Hydroxyprogesterone Anaphylaxis  ? Latex Hives  ?  Gloves (when worn)  ? Meat Extract Anaphylaxis  ?  Red meat-ALPHAGAL ALLERGY  ? Lactose Intolerance (Gi) Other (See Comments)  ?  Gi upset  ? Other Other (See Comments)  ?   Bleach, Neosporin: Rash  ? ? ?Metabolic Disorder Labs: ?Lab Results  ?Component Value Date  ? HGBA1C 4.8 12/11/2018  ? MPG 91.06 12/11/2018  ? ?No results found for: PROLACTIN ?Lab Results  ?Component Va

## 2022-04-26 ENCOUNTER — Encounter: Payer: Self-pay | Admitting: Psychiatry

## 2022-04-26 ENCOUNTER — Telehealth: Payer: Self-pay | Admitting: Psychiatry

## 2022-04-26 ENCOUNTER — Ambulatory Visit (INDEPENDENT_AMBULATORY_CARE_PROVIDER_SITE_OTHER): Payer: 59 | Admitting: Psychiatry

## 2022-04-26 DIAGNOSIS — F4310 Post-traumatic stress disorder, unspecified: Secondary | ICD-10-CM | POA: Diagnosis not present

## 2022-04-26 DIAGNOSIS — F3175 Bipolar disorder, in partial remission, most recent episode depressed: Secondary | ICD-10-CM | POA: Diagnosis not present

## 2022-04-26 MED ORDER — ARIPIPRAZOLE 5 MG PO TABS: 5.0000 mg | ORAL_TABLET | Freq: Every day | ORAL | 0 refills | Status: DC

## 2022-04-26 NOTE — Patient Instructions (Signed)
Start Abilify 5 mg daily  ?Referral to therapy  ?Next appointment- 6/2 at 8:30  ?

## 2022-05-24 ENCOUNTER — Other Ambulatory Visit: Payer: Self-pay | Admitting: Psychiatry

## 2022-05-24 LAB — HM HEPATITIS C SCREENING LAB: HM Hepatitis Screen: NEGATIVE

## 2022-05-24 NOTE — Progress Notes (Deleted)
BH MD/PA/NP OP Progress Note  05/24/2022 11:51 AM [facility] Tom Branch  MRN:  [id number]  Chief Complaint: No chief complaint on file.  HPI: *** Visit Diagnosis: No diagnosis found.  Past Psychiatric History: Please see initial evaluation for full details. I have reviewed the history. No updates at this time.     Past Medical History:  Past Medical History:  Diagnosis Date   ADHD    Adopted    no family medical hx   Anxiety    Asthma    Bipolar disorder (HCC)    two prior suicide attempts - one with overdose on acetaminophen, another with ''sliced veins''    COVID-19    LAST HAD 12-2020   Depression    ptsd   Family history of adverse reaction to anesthesia    pt is adopted and is unsure   GERD (gastroesophageal reflux disease)    Headache    migraines   History of kidney stones    Palpitations    H/O WITH COVID-NONE SINCE   Wears contact lenses     Past Surgical History:  Procedure Laterality Date   LITHOTRIPSY     TONSILLECTOMY N/A 12/13/2016   Procedure: TONSILLECTOMY;  Surgeon: Bud Face, MD;  Location: [facility];  Service: ENT;  Laterality: N/A;   ULNAR NERVE TRANSPOSITION Left 10/09/2018   Procedure: SUBCUTANEOUS TRANSPOSITION OF THE ULNAR NERVE OF LEFT ELBOW;  Surgeon: Christena Flake, MD;  Location: [facility];  Service: Orthopedics;  Laterality: Left;   WISDOM TOOTH EXTRACTION      Family Psychiatric History: Please see initial evaluation for full details. I have reviewed the history. No updates at this time.     Family History:  Family History  Adopted: Yes  Problem Relation Age of Onset   Drug abuse Mother    Alcohol abuse Maternal Grandfather    Alcohol abuse Maternal Grandmother     Social History:  Social History   Socioeconomic History   Marital status: Single    Spouse name: Not on file   Number of children: Not on file   Years of education: Not on file   Highest education level: Not on file   Occupational History   Not on file  Tobacco Use   Smoking status: Some Days    Types: Cigars   Smokeless tobacco: Current    Types: Chew   Tobacco comments:    1-2 cigars per week  Vaping Use   Vaping Use: Never used  Substance and Sexual Activity   Alcohol use: Yes    Comment: occ wine   Drug use: No   Sexual activity: Not on file  Other Topics Concern   Not on file  Social History Narrative   Not on file   Social Determinants of Health   Financial Resource Strain: Not on file  Food Insecurity: Not on file  Transportation Needs: Not on file  Physical Activity: Not on file  Stress: Not on file  Social Connections: Not on file    Allergies:  Allergies  Allergen Reactions   Dilaudid [Hydromorphone Hcl] Hives   Hydroxyprogesterone Anaphylaxis   Latex Hives    Gloves (when worn)   Meat Extract Anaphylaxis    Red meat-ALPHAGAL ALLERGY   Lactose Intolerance (Gi) Other (See Comments)    Gi upset   Other Other (See Comments)    Bleach, Neosporin: Rash    Metabolic Disorder Labs: Lab Results  Component Value Date   HGBA1C 4.8  12/11/2018   MPG 91.06 12/11/2018   No results found for: PROLACTIN Lab Results  Component Value Date   CHOL 172 12/11/2018   TRIG 137 12/11/2018   HDL 33 (L) 12/11/2018   CHOLHDL 5.2 12/11/2018   VLDL 27 12/11/2018   LDLCALC 112 (H) 12/11/2018   Lab Results  Component Value Date   TSH 1.043 12/11/2018    Therapeutic Level Labs: No results found for: LITHIUM No results found for: VALPROATE No components found for:  CBMZ  Current Medications: Current Outpatient Medications  Medication Sig Dispense Refill   albuterol (PROVENTIL HFA;VENTOLIN HFA) 108 (90 Base) MCG/ACT inhaler Inhale 2 puffs into the lungs every 6 (six) hours as needed for wheezing or shortness of breath.     ARIPiprazole (ABILIFY) 5 MG tablet TAKE 1 TABLET BY MOUTH EVERYDAY AT BEDTIME 30 tablet 0   dicyclomine (BENTYL) 10 MG capsule Take 1 capsule (10 mg total)  by mouth 4 (four) times daily for 14 days. 56 capsule 0   EPINEPHrine (EPIPEN 2-PAK) 0.3 mg/0.3 mL IJ SOAJ injection Inject 0.3 mLs (0.3 mg total) into the muscle once. 1 Device 0   esomeprazole (NEXIUM) 20 MG capsule Take 20 mg by mouth daily as needed for heartburn.     ibuprofen (ADVIL) 800 MG tablet Take 1 tablet (800 mg total) by mouth every 8 (eight) hours as needed for mild pain or moderate pain. 30 tablet 0   methylphenidate 36 MG PO CR tablet Take 36 mg by mouth daily as needed (ADHD).     traMADol (ULTRAM) 50 MG tablet Take 1 tablet (50 mg total) by mouth every 6 (six) hours as needed for up to 6 doses for severe pain. 6 tablet 0   No current facility-administered medications for this visit.     Musculoskeletal: Strength & Muscle Tone:  N/A Gait & Station:  N/A Patient leans: N/A  Psychiatric Specialty Exam: Review of Systems  There were no vitals taken for this visit.There is no height or weight on file to calculate BMI.  General Appearance: {Appearance:22683}  Eye Contact:  {BHH EYE CONTACT:22684}  Speech:  Clear and Coherent  Volume:  Normal  Mood:  {BHH MOOD:22306}  Affect:  {Affect (PAA):22687}  Thought Process:  Coherent  Orientation:  Full (Time, Place, and Person)  Thought Content: Logical   Suicidal Thoughts:  {ST/HT (PAA):22692}  Homicidal Thoughts:  {ST/HT (PAA):22692}  Memory:  Immediate;   Good  Judgement:  {Judgement (PAA):22694}  Insight:  {Insight (PAA):22695}  Psychomotor Activity:  Normal  Concentration:  Concentration: Good and Attention Span: Good  Recall:  Good  Fund of Knowledge: Good  Language: Good  Akathisia:  No  Handed:  Right  AIMS (if indicated): not done  Assets:  Communication Skills Desire for Improvement  ADL's:  Intact  Cognition: WNL  Sleep:  {BHH GOOD/FAIR/POOR:22877}   Screenings: AIMS    Flowsheet Row Admission (Discharged) [facility]  AIMS Total Score 0      AUDIT     Flowsheet Row [facility]Dis[facility]  Alcohol Use Disorder Identification Test Final Score (AUDIT) 1      PHQ2-9    Flowsheet Row [facility]  PHQ-2 Total Score 1      Flowsheet Row [facility] Admission (Discharged[facility]  C-SSRS RISK CATEGORY No Risk No Risk No Risk        Assessment and Plan:  [facility] Jameire Kouba is a 26 y.o. year old male with a history of  bipolar disorder, PTSD, ADHD,  abnormal LFT, asthma, who presents for follow up appointment for below.     1. Bipolar disorder, in partial remission, most recent episode depressed (HCC) 2. PTSD (post-traumatic stress disorder) # r/o Bipolar II disorder # r/o MDD He reports ongoing irritability and PTSD symptoms over the past several months in the setting of being off his medication.  Psychosocial stressors include trauma history/loss of his friend during the training.  Will restart Abilify to target irritability and his other mood symptoms given he reports great benefit from this medication, and has history of bipolar disorder.  Will consider starting antidepressant in the near future to target PTSD.  Noted that although exam is notable for verbose speech, it is not pressured, and he denies any significant manic symptoms except occasional euphoria and impulsive shopping.  It is unclear whether he truly has underlying bipolar disorder.  Will continue to monitor this.  He will greatly benefit from CBT; will make referral.      Plan Start Abilify 5 mg daily  Referral to therapy  Next appointment- 6/2 at 8:30 for 30 mins, video   The patient demonstrates the following risk factors for suicide: Chronic risk  factors for suicide include: psychiatric disorder of PTSD and previous suicide attempts of overdosing, hanging himself, cutting . Acute risk factors for suicide include: loss (financial, interpersonal, professional). Protective factors for this patient include: positive social support, responsibility to others (children, family), and hope for the future. Considering these factors, the overall suicide risk at this point appears to be low. Patient is appropriate for outpatient follow up.           Collaboration of Care: Collaboration of Care: {BH OP Collaboration of Care:21014065}  Patient/Guardian was advised Release of Information must be obtained prior to any record release in order to collaborate their care with an outside provider. Patient/Guardian was advised if they have not already done so to contact the registration department to sign all necessary forms in order for [place] to release information regarding their care.   Consent: Patient/Guardian gives verbal consent for treatment and assignment of benefits for services provided during this visit. Patient/Guardian expressed understanding and agreed to proceed.    Neysa Hotter, MD 05/24/2022, 11:51 AM

## 2022-05-26 ENCOUNTER — Telehealth: Payer: 59 | Admitting: Psychiatry

## 2022-05-26 ENCOUNTER — Telehealth: Payer: Self-pay | Admitting: Psychiatry

## 2022-05-26 NOTE — Telephone Encounter (Signed)
Sent link for video visit through Epic. Patient did not sign in. Called the patient for appointment scheduled today. The patient did not answer the phone. Left voice message to contact the office ([phone]).   ?

## 2022-06-24 ENCOUNTER — Other Ambulatory Visit: Payer: Self-pay | Admitting: Psychiatry

## 2022-08-18 NOTE — Telephone Encounter (Signed)
Called patient to clarify insurance coverage.

## 2022-08-18 NOTE — Telephone Encounter (Signed)
error 

## 2023-06-11 IMAGING — CR DG CHEST 2V
1 series · 2 of 2 positions shown · non-contrast
Comparison: Chest radiograph 11/01/2014

CLINICAL DATA: Chemical exposure.

EXAM:
CHEST - 2 VIEW

[Series 1: dg chest 2 view · 0.14mm/px · 2 of 2 slices shown]
[im 1/2]
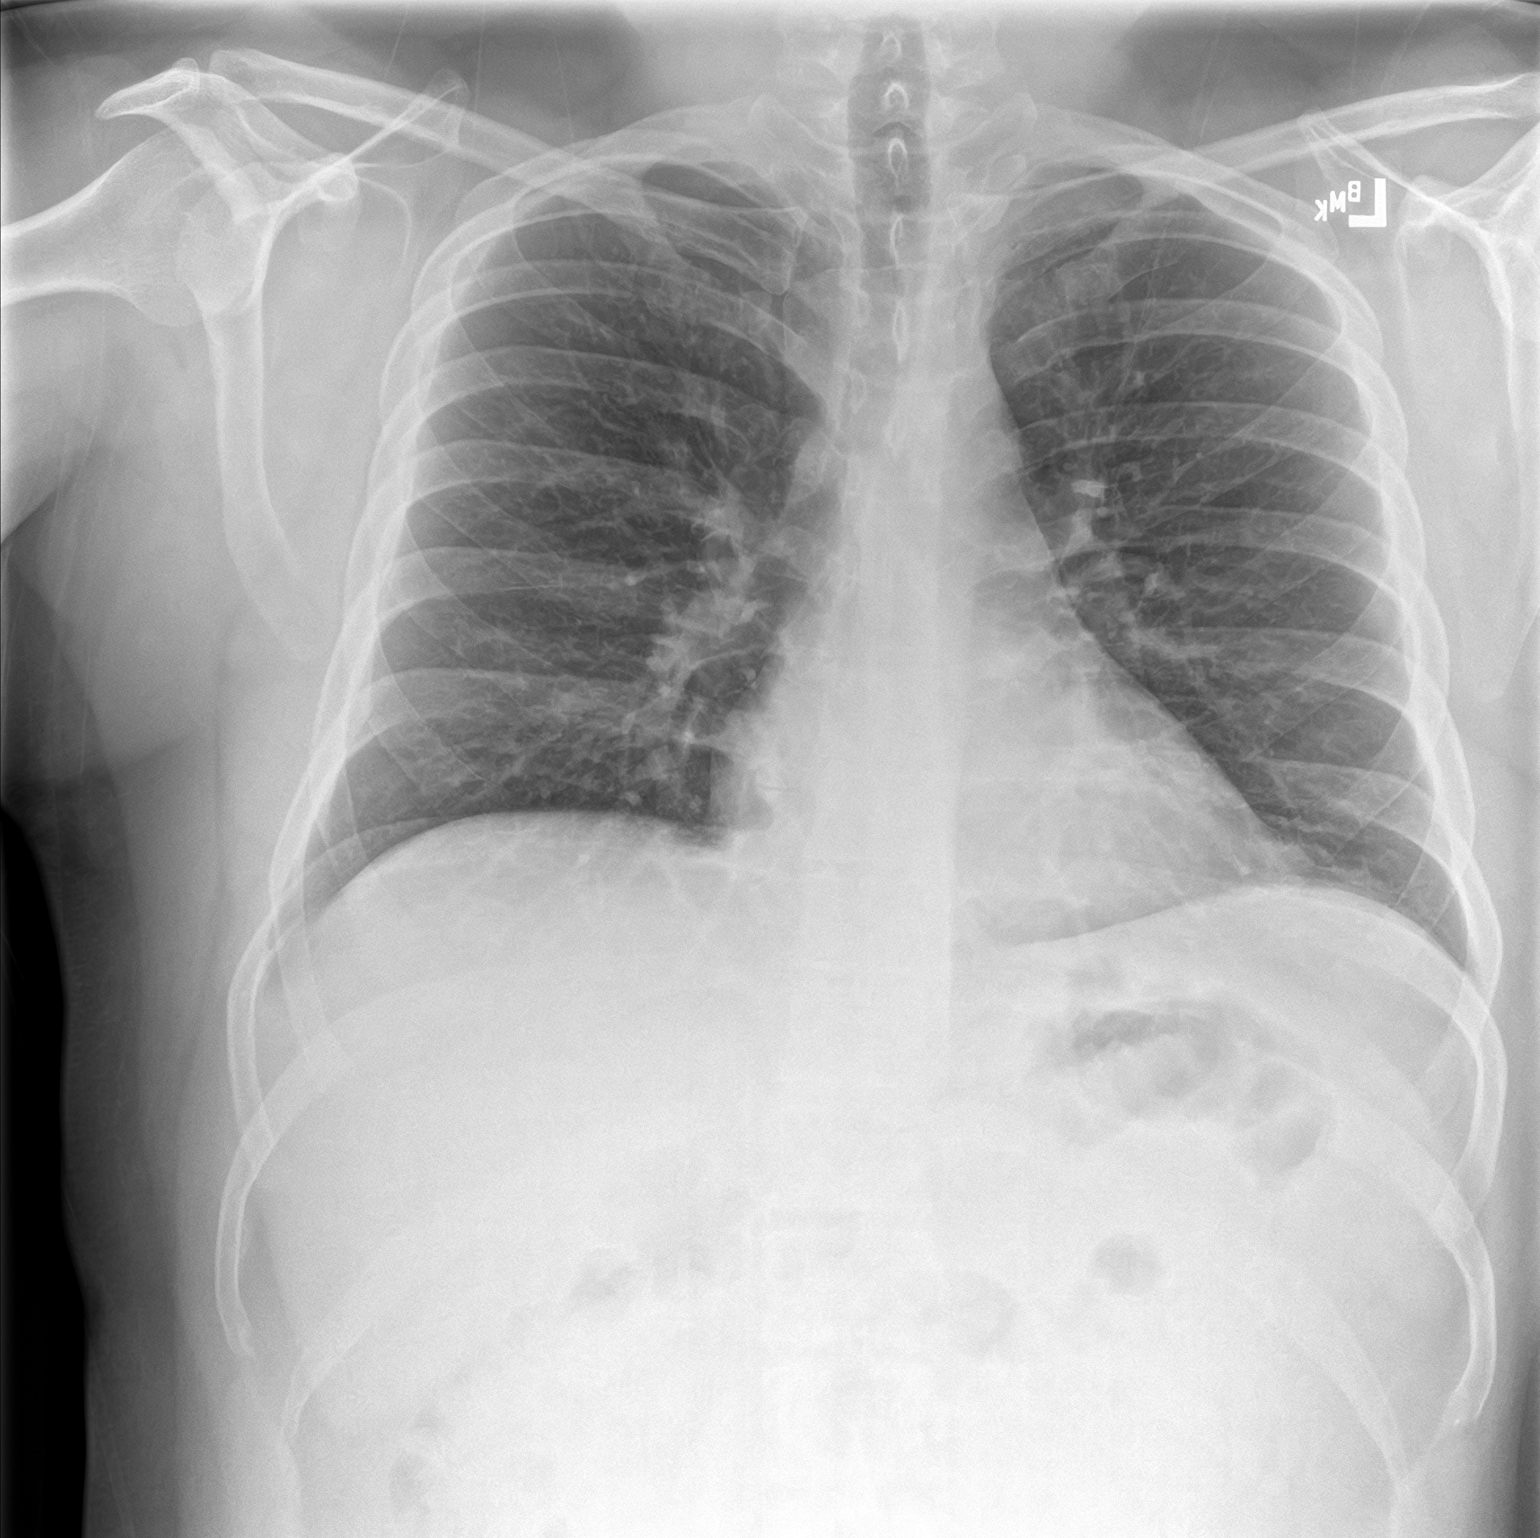
[im 2/2]
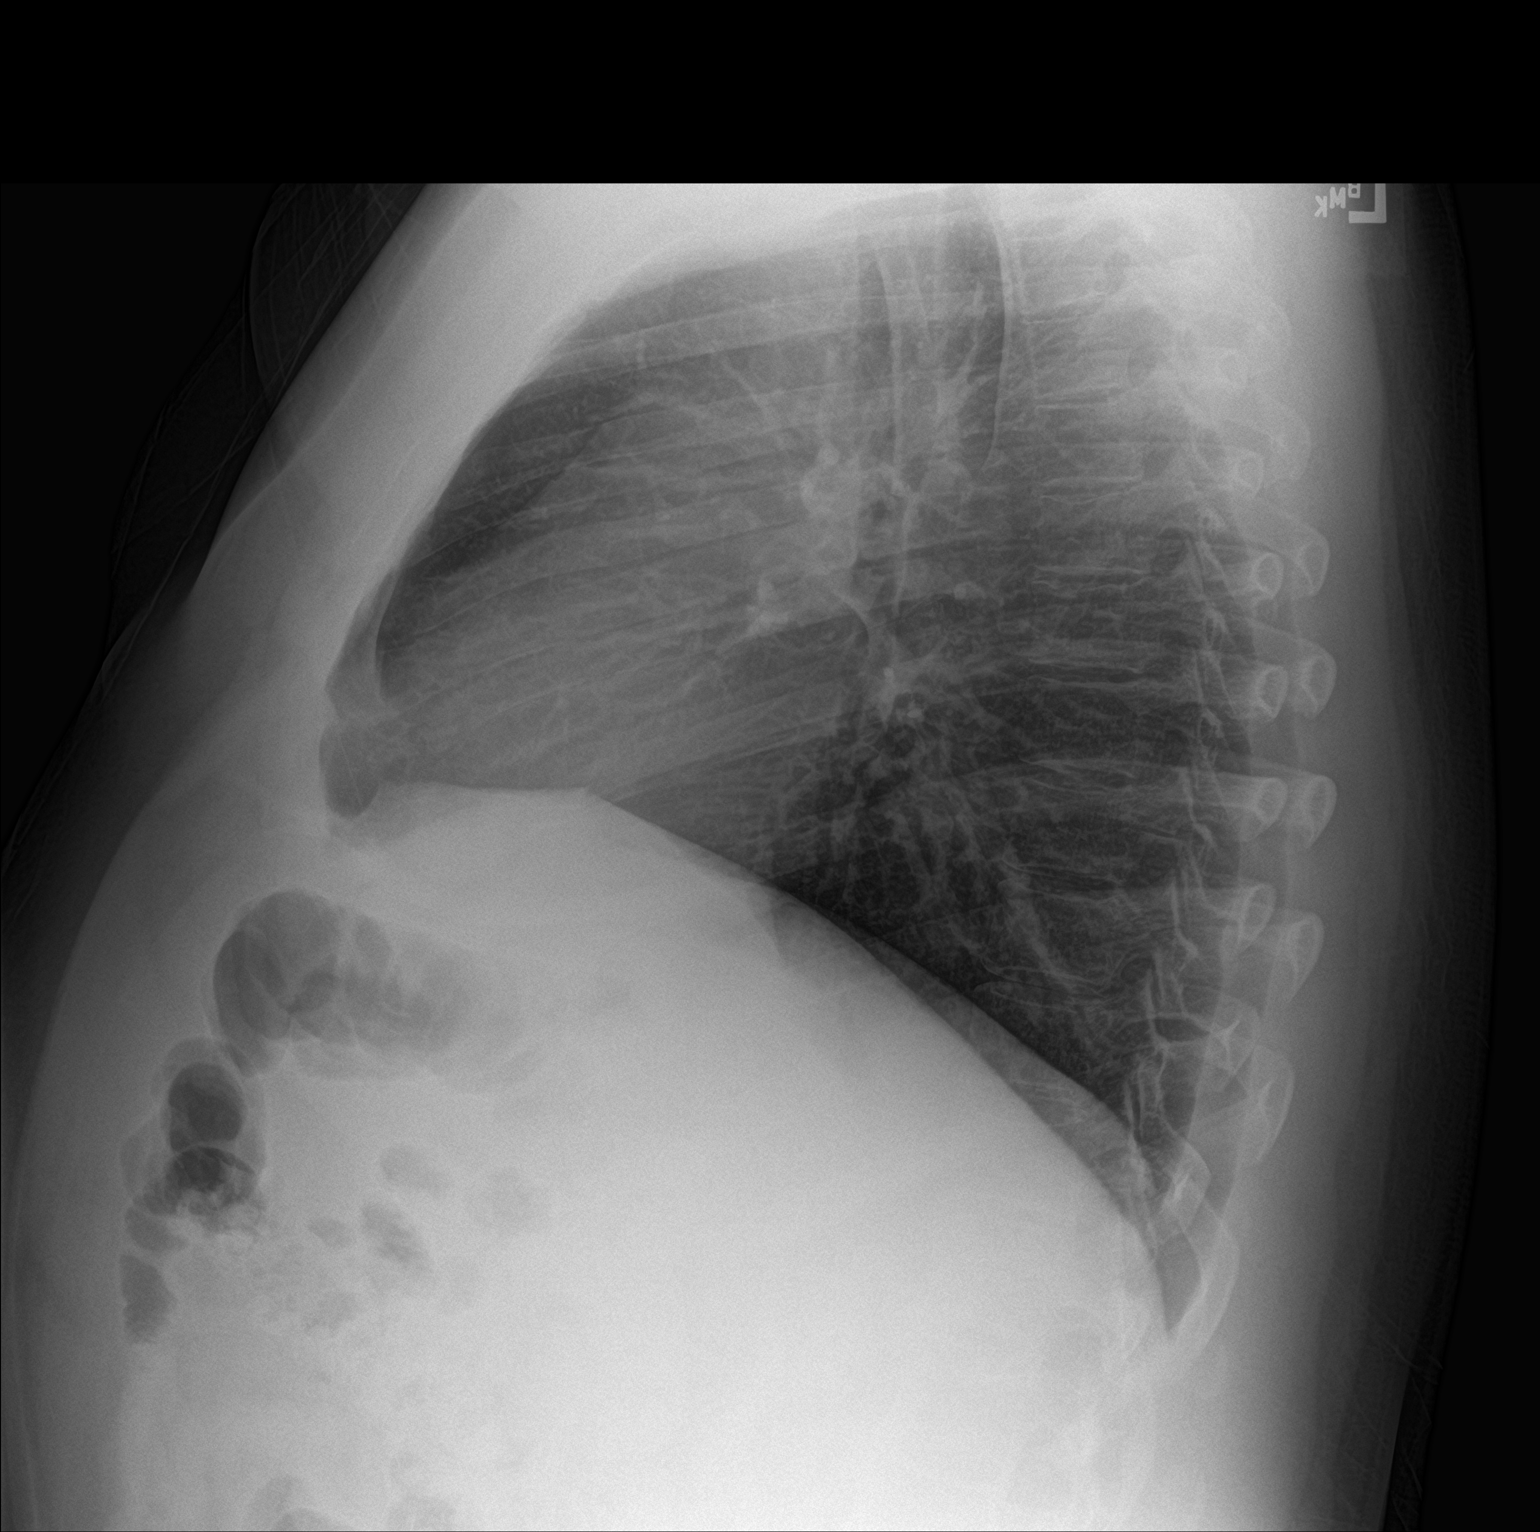

[2 of 2 positions shown; findings below may reference images not displayed]

FINDINGS: Normal cardiomediastinal contours. There are a few small opacities
the left lung base which are nonspecific and could reflect
atelectasis. The right lung is clear. No pneumothorax or pleural
effusion. No acute finding in the regional skeleton.
IMPRESSION: Mild left basilar opacities which are nonspecific, possibly
representing atelectasis.

## 2024-10-03 ENCOUNTER — Encounter: Payer: Self-pay | Admitting: General Practice

## 2024-10-03 ENCOUNTER — Encounter: Payer: Self-pay | Admitting: Internal Medicine

## 2024-10-03 ENCOUNTER — Ambulatory Visit: Payer: Self-pay | Admitting: General Practice

## 2024-10-03 VITALS — BP 120/82 | HR 81 | Temp 98.3°F | Ht 68.0 in | Wt 220.0 lb

## 2024-10-03 DIAGNOSIS — J4520 Mild intermittent asthma, uncomplicated: Secondary | ICD-10-CM

## 2024-10-03 DIAGNOSIS — R7303 Prediabetes: Secondary | ICD-10-CM | POA: Diagnosis not present

## 2024-10-03 DIAGNOSIS — H918X3 Other specified hearing loss, bilateral: Secondary | ICD-10-CM

## 2024-10-03 DIAGNOSIS — Z7689 Persons encountering health services in other specified circumstances: Secondary | ICD-10-CM | POA: Insufficient documentation

## 2024-10-03 DIAGNOSIS — Z23 Encounter for immunization: Secondary | ICD-10-CM | POA: Diagnosis not present

## 2024-10-03 DIAGNOSIS — F314 Bipolar disorder, current episode depressed, severe, without psychotic features: Secondary | ICD-10-CM

## 2024-10-03 DIAGNOSIS — F902 Attention-deficit hyperactivity disorder, combined type: Secondary | ICD-10-CM | POA: Diagnosis not present

## 2024-10-03 DIAGNOSIS — H9190 Unspecified hearing loss, unspecified ear: Secondary | ICD-10-CM | POA: Insufficient documentation

## 2024-10-03 DIAGNOSIS — Z716 Tobacco abuse counseling: Secondary | ICD-10-CM

## 2024-10-03 DIAGNOSIS — F429 Obsessive-compulsive disorder, unspecified: Secondary | ICD-10-CM

## 2024-10-03 DIAGNOSIS — K219 Gastro-esophageal reflux disease without esophagitis: Secondary | ICD-10-CM

## 2024-10-03 DIAGNOSIS — Z87892 Personal history of anaphylaxis: Secondary | ICD-10-CM

## 2024-10-03 DIAGNOSIS — F4310 Post-traumatic stress disorder, unspecified: Secondary | ICD-10-CM

## 2024-10-03 MED ORDER — PANTOPRAZOLE SODIUM 40 MG PO TBEC: 40.0000 mg | DELAYED_RELEASE_TABLET | Freq: Every day | ORAL | 3 refills | Status: AC

## 2024-10-03 MED ORDER — BUDESONIDE-FORMOTEROL FUMARATE 80-4.5 MCG/ACT IN AERO: 2.0000 | INHALATION_SPRAY | Freq: Two times a day (BID) | RESPIRATORY_TRACT | 3 refills | Status: AC

## 2024-10-03 MED ORDER — EPINEPHRINE 0.3 MG/0.3ML IJ SOAJ: 0.3000 mg | Freq: Once | INTRAMUSCULAR | 0 refills | Status: AC

## 2024-10-03 NOTE — Assessment & Plan Note (Signed)
 Diagnosed as a child.  Following with psychiatrist at [facility].

## 2024-10-03 NOTE — Assessment & Plan Note (Signed)
 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  Discussed to avoid red meats.  Refill sent for epi pen.

## 2024-10-03 NOTE — Assessment & Plan Note (Signed)
 Smoking cessation instruction/counseling given:  counseled patient on the dangers of tobacco use, advised patient to stop smoking, and reviewed strategies to maximize success

## 2024-10-03 NOTE — Assessment & Plan Note (Signed)
 Controlled.  Following with psychiatry- [facility]. In therapy.

## 2024-10-03 NOTE — Assessment & Plan Note (Signed)
 Chronic.  History of serving in the [employer].  No red flags on exam.  Referral placed for ENT.

## 2024-10-03 NOTE — Patient Instructions (Signed)
 Start Symbicort 2 puffs twice a day. Use albuterol as needed.   Stop Nexium.   Start Pantoprazole 40 mg once daily.   You will either be contacted via phone regarding your referral to ENT , or you may receive a letter on your MyChart portal from our referral team with instructions for scheduling an appointment. Please let us  know if you have not been contacted by anyone within two weeks.  Follow up in 4 weeks.   It was a pleasure to meet you today! Please don't hesitate to contact me with any questions. Welcome to [facility]!

## 2024-10-03 NOTE — Assessment & Plan Note (Addendum)
 Reviewed labs from June.  A1c 5.2.  Discussed the importance of monitoring diet and exercise.

## 2024-10-03 NOTE — Assessment & Plan Note (Signed)
 Controlled without meds.  Following with psychiatry at [facility].

## 2024-10-03 NOTE — Assessment & Plan Note (Signed)
 Nexium causing itching given his Alpha gal allergy.  Discussed symptoms at length.  Start Pantoprazole 40 mg once daily. Rx sent.  F/u in 4 weeks.

## 2024-10-03 NOTE — Assessment & Plan Note (Signed)
 EMR reviewed briefly.

## 2024-10-03 NOTE — Assessment & Plan Note (Addendum)
 Uncontrolled. No red flags on exam. Start Symbicort 2 puffs BID. Continue albuterol as needed. F/u in 4 weeks.

## 2024-10-03 NOTE — Progress Notes (Signed)
 New Patient Office Visit  Subjective    Patient ID: Tom Branch, male    DOB: 04-18-1996  Age: 28 y.o. MRN: [id number]  CC:  Chief Complaint  Patient presents with   New Patient (Initial Visit)    Establish care    HPI [facility] [name] is a 28 y.o. male presents to establish care.  Previous PCP/physical/labs: Dr. Sadie. Physical -   Discussed the use of AI scribe software for clinical note transcription with the patient, who gave verbal consent to proceed.  History of Present Illness [facility] Tom Branch is a 28 year old male who presents to establish care.  He has not had a physical this year but had blood work done in June, which showed slightly elevated cholesterol and a normal A1c of 5.2. He was previously told he was prediabetic but has since improved his A1c through lifestyle changes.  He has a history of asthma and uses albuterol as needed. He experiences a persistent cough lasting a minimum of two months whenever he gets sick and occasionally uses his inhaler more than twice a week.  He has a history of mental health issues, including PTSD, bipolar disorder, depression, and OCD, with two past suicide attempts and hospitalizations at [facility] and [facility] mental health facility. He is currently not on medication but receives therapy through [employer]. He also has a history of ADHD and was on medication for about 15 years, feeling he could use it again on some workdays.  He has a history of hearing issues, worsened since leaving the military, with tinnitus and difficulty hearing without visual cues. [facility].  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 He has been red meat-free since 2014 after two severe anaphylactic reactions. He takes Nexium as needed for acid reflux but experiences itching after taking  it.  He occasionally smokes cigars and uses nicotine salt instead of chewing tobacco, which he used to do in the [employer]. He does not inhale cigars and smokes them rarely, about two or three times a year.   Outpatient Encounter Medications as of 10/03/2024  Medication Sig   albuterol (PROVENTIL HFA;VENTOLIN HFA) 108 (90 Base) MCG/ACT inhaler Inhale 2 puffs into the lungs every 6 (six) hours as needed for wheezing or shortness of breath.   budesonide-formoterol (SYMBICORT) 80-4.5 MCG/ACT inhaler Inhale 2 puffs into the lungs 2 (two) times daily.   esomeprazole (NEXIUM) 20 MG capsule Take 20 mg by mouth daily as needed for heartburn.   Multiple Vitamins-Minerals (ZINC PO) Take by mouth.   pantoprazole (PROTONIX) 40 MG tablet Take 1 tablet (40 mg total) by mouth daily.   [DISCONTINUED] EPINEPHrine  (EPIPEN  2-PAK) 0.3 mg/0.3 mL IJ SOAJ injection Inject 0.3 mLs (0.3 mg total) into the muscle once.   [DISCONTINUED] methylphenidate  36 MG PO CR tablet Take 36 mg by mouth daily as needed (ADHD).   [DISCONTINUED] traMADol  (ULTRAM ) 50 MG tablet Take 1 tablet (50 mg total) by mouth every 6 (six) hours as needed for up to 6 doses for severe pain.   EPINEPHrine  (EPIPEN  2-PAK) 0.3 mg/0.3 mL IJ SOAJ injection Inject 0.3 mg into the muscle once for 1 dose.   [DISCONTINUED] ARIPiprazole  (ABILIFY ) 5 MG tablet TAKE 1 TABLET BY MOUTH EVERYDAY AT BEDTIME   [DISCONTINUED] dicyclomine  (BENTYL ) 10 MG capsule Take 1 capsule (10 mg total) by  mouth 4 (four) times daily for 14 days.   [DISCONTINUED] ibuprofen  (ADVIL ) 800 MG tablet Take 1 tablet (800 mg total) by mouth every 8 (eight) hours as needed for mild pain or moderate pain.   No facility-administered encounter medications on file as of 10/03/2024.    Past Medical History:  Diagnosis Date   ADHD    Adopted    no family medical hx   Anxiety    Asthma    Bipolar disorder (HCC)    two prior suicide attempts - one with overdose on acetaminophen , another with  ''sliced veins''    COVID-19    LAST HAD 12-2020   Depression    ptsd   Family history of adverse reaction to anesthesia    pt is adopted and is unsure   GERD (gastroesophageal reflux disease)    Headache    migraines   History of kidney stones    Hyperlipidemia    Hypertension    Palpitations    H/O WITH COVID-NONE SINCE   Self-inflicted laceration of wrist (HCC) 12/10/2018   Wears contact lenses     Past Surgical History:  Procedure Laterality Date   LITHOTRIPSY     TONSILLECTOMY N/A 12/13/2016   Procedure: TONSILLECTOMY;  Surgeon: Carolee Hunter, MD;  Location: [facility];  Service: ENT;  Laterality: N/A;   ULNAR NERVE TRANSPOSITION Left 10/09/2018   Procedure: SUBCUTANEOUS TRANSPOSITION OF THE ULNAR NERVE OF LEFT ELBOW;  Surgeon: Edie Norleen PARAS, MD;  Location: [facility];  Service: Orthopedics;  Laterality: Left;   WISDOM TOOTH EXTRACTION      Family History  Adopted: Yes  Problem Relation Age of Onset   Drug abuse Mother    Alcohol abuse Maternal Grandfather    Alcohol abuse Maternal Grandmother     Social History   Socioeconomic History   Marital status: Married    Spouse name: Not on file   Number of children: Not on file   Years of education: Not on file   Highest education level: Associate degree: occupational, Scientist, product/process development, or vocational program  Occupational History   Occupation: yard Comptroller: [employer]  Tobacco Use   Smoking status: Some Days    Types: Cigars   Smokeless tobacco: Current    Types: Chew   Tobacco comments:    1-2 cigars per week  Vaping Use   Vaping status: Never Used  Substance and Sexual Activity   Alcohol use: Yes    Comment: occ wine   Drug use: No   Sexual activity: Yes  Other Topics Concern   Not on file  Social History Narrative   Not on file   Social Drivers of Health   Financial Resource Strain: Low Risk  (09/29/2024)   Overall Financial Resource Strain (CARDIA)     Difficulty of Paying Living Expenses: Not hard at all  Food Insecurity: No Food Insecurity (09/29/2024)   Hunger Vital Sign    Worried About Running Out of Food in the Last Year: Never true    Ran Out of Food in the Last Year: Never true  Transportation Needs: No Transportation Needs (09/29/2024)   PRAPARE - Administrator, Civil Service (Medical): No    Lack of Transportation (Non-Medical): No  Physical Activity: Sufficiently Active (09/29/2024)   Exercise Vital Sign    Days of Exercise per Week: 5 days    Minutes of Exercise per Session: 30 min  Stress: Stress Concern Present (09/29/2024)  [employer] of Occupational Health - Occupational Stress Questionnaire    Feeling of Stress: To some extent  Social Connections: Socially Integrated (09/29/2024)   Social Connection and Isolation Panel    Frequency of Communication with Friends and Family: More than three times a week    Frequency of Social Gatherings with Friends and Family: More than three times a week    Attends Religious Services: More than 4 times per year    Active Member of [employer] or Organizations: Yes    Attends Engineer, structural: More than 4 times per year    Marital Status: Married  Catering manager Violence: Not on file    Review of Systems  Constitutional:  Negative for chills and fever.  HENT:  Positive for hearing loss and tinnitus.   Respiratory:  Negative for shortness of breath.   Cardiovascular:  Negative for chest pain.  Gastrointestinal:  Negative for abdominal pain, constipation, diarrhea, heartburn, nausea and vomiting.  Genitourinary:  Negative for dysuria, frequency and urgency.  Neurological:  Negative for dizziness and headaches.  Endo/Heme/Allergies:  Negative for polydipsia.  Psychiatric/Behavioral:  Negative for depression and suicidal ideas. The patient is not nervous/anxious.         Objective    BP 120/82   Pulse 81   Temp 98.3 F (36.8 C) (Oral)   Ht 5' 8  (1.727 m)   Wt 220 lb (99.8 kg)   SpO2 97%   BMI 33.45 kg/m   Physical Exam Vitals and nursing note reviewed.  Constitutional:      Appearance: Normal appearance.  HENT:     Right Ear: Tympanic membrane, ear canal and external ear normal. Decreased hearing noted.     Left Ear: Tympanic membrane, ear canal and external ear normal. Decreased hearing noted.     Nose: No congestion.  Cardiovascular:     Rate and Rhythm: Normal rate and regular rhythm.     Pulses: Normal pulses.     Heart sounds: Normal heart sounds.  Pulmonary:     Effort: Pulmonary effort is normal.     Breath sounds: Normal breath sounds.  Neurological:     Mental Status: He is alert and oriented to person, place, and time.  Psychiatric:        Mood and Affect: Mood normal.        Behavior: Behavior normal.        Thought Content: Thought content normal.        Judgment: Judgment normal.         Assessment & Plan:  Mild intermittent asthma without complication Assessment & Plan: Uncontrolled. No red flags on exam. Start Symbicort 2 puffs BID. Continue albuterol as needed. F/u in 4 weeks.  Orders: -     Budesonide-Formoterol Fumarate; Inhale 2 puffs into the lungs 2 (two) times daily.  Dispense: 1 each; Refill: 3  Establishing care with new doctor, encounter for Assessment & Plan: EMR reviewed briefly.    Prediabetes Assessment & Plan: Reviewed labs from June.  A1c 5.2.  Discussed the importance of monitoring diet and exercise.    Attention deficit hyperactivity disorder (ADHD), combined type Assessment & Plan: Diagnosed as a child.  Following with psychiatrist at [facility].   Bipolar I disorder, most recent episode depressed, severe without psychotic features (HCC) Assessment & Plan: Controlled without meds.  Following with psychiatry at [facility].   Obsessive-compulsive disorder, unspecified type Assessment & Plan: Controlled.  Following with [facility]   PTSD  (  post-traumatic stress disorder) Assessment & Plan: Controlled.  Following with psychiatry- [facility]. In therapy.    Gastroesophageal reflux disease, unspecified whether esophagitis present Assessment & Plan: Nexium causing itching given his Alpha gal allergy.  Discussed symptoms at length.  Start Pantoprazole 40 mg once daily. Rx sent.  F/u in 4 weeks.  Orders: -     Pantoprazole Sodium; Take 1 tablet (40 mg total) by mouth daily.  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  Discussed to avoid red meats.  Refill sent for epi pen.  Orders: -     EPINEPHrine ; Inject 0.3 mg into the muscle once for 1 dose.  Dispense: 1 each; Refill: 0  Other specified hearing loss of both ears Assessment & Plan: Chronic.  History of serving in the [employer].  No red flags on exam.  Referral placed for ENT.  Orders: -     Ambulatory referral to ENT  Tobacco abuse counseling Assessment & Plan: Smoking cessation instruction/counseling given:  counseled patient on the dangers of tobacco use, advised patient to stop smoking, and reviewed strategies to maximize success   Need for pneumococcal 20-valent conjugate vaccination -     Pneumococcal conjugate vaccine 20-valent     Return in about 4 weeks (around 10/31/2024) for GERD, ASTHMA.   Carrol Aurora, NP

## 2024-10-03 NOTE — Assessment & Plan Note (Signed)
 Controlled.  Following with [facility]

## 2024-10-06 ENCOUNTER — Encounter: Payer: Self-pay | Admitting: General Practice

## 2024-10-08 ENCOUNTER — Ambulatory Visit: Admitting: General Practice

## 2024-10-27 ENCOUNTER — Ambulatory Visit: Admitting: General Practice

## 2024-10-27 ENCOUNTER — Encounter: Payer: Self-pay | Admitting: *Deleted

## 2024-10-27 ENCOUNTER — Encounter: Payer: Self-pay | Admitting: General Practice

## 2024-10-27 VITALS — BP 116/72 | HR 84 | Temp 98.2°F | Ht 68.0 in | Wt 221.0 lb

## 2024-10-27 DIAGNOSIS — J4520 Mild intermittent asthma, uncomplicated: Secondary | ICD-10-CM

## 2024-10-27 DIAGNOSIS — K219 Gastro-esophageal reflux disease without esophagitis: Secondary | ICD-10-CM

## 2024-10-27 NOTE — Progress Notes (Signed)
 Established Patient Office Visit  Subjective   Patient ID: Tom Branch, male    DOB: December 21, 1996  Age: 28 y.o. MRN: [id number]  Chief Complaint  Patient presents with   Gastroesophageal Reflux    Patient following up on GERD; currently taking protonix and doing better.    Asthma    Patient doing well but states he can not drink an energy drink on the smybicort without being up for days.     Gastroesophageal Reflux He reports no abdominal pain, no chest pain, no heartburn or no nausea.    Tom Branch is a 28 year old male with past medical history of asthma, GED, hearing loss, bipolar disorder, PTSD, OCD, ADHD, prediabetes presents today for a follow up.   Discussed the use of AI scribe software for clinical note transcription with the patient, who gave verbal consent to proceed.  History of Present Illness [facility] Tom Branch is a 28 year old male with gastroesophageal reflux disease and asthma who presents for follow-up.  He was started on Protonix a few weeks ago for gastroesophageal reflux disease and feels better. He takes it once a day, 20 minutes before breakfast, and requires a refill. No itching with his current medications, unlike with Nexium, which caused itching previously.  He was also started on Symbicort, two puffs in the morning and two puffs at night, for asthma management. He has improved breathing but experiences difficulty sleeping, which he attributes to the medication. Energy drinks exacerbate this issue, causing him to stay awake for extended periods.  He is awaiting an ENT referral, which he has not yet received any communication about.  He recalls having a physical exam in June or July of this year. He is not currently on any psychiatric medications but engages in therapy through [facility]. He is also cutting back on cigar use.    Patient Active Problem List   Diagnosis Date Noted   Establishing care with  new doctor, encounter for 10/03/2024   Prediabetes 10/03/2024   Gastroesophageal reflux disease 10/03/2024   History of anaphylaxis 10/03/2024   Hearing loss 10/03/2024   Tobacco abuse counseling 10/03/2024   Attention deficit hyperactivity disorder (ADHD) 12/13/2018   PTSD (post-traumatic stress disorder) 12/11/2018   OCD (obsessive compulsive disorder) 12/11/2018   Bipolar I disorder, most recent episode depressed, severe without psychotic features (HCC) 12/10/2018   Asthma, mild 04/28/2015   Past Medical History:  Diagnosis Date   ADHD    Adopted    no family medical hx   Anxiety    Asthma    Bipolar disorder (HCC)    two prior suicide attempts - one with overdose on acetaminophen , another with ''sliced veins''    COVID-19    LAST HAD 12-2020   Depression    ptsd   Family history of adverse reaction to anesthesia    pt is adopted and is unsure   GERD (gastroesophageal reflux disease)    Headache    migraines   History of kidney stones    Hyperlipidemia    Hypertension    Palpitations    H/O WITH COVID-NONE SINCE   Self-inflicted laceration of wrist (HCC) 12/10/2018   Wears contact lenses    Past Surgical History:  Procedure Laterality Date   LITHOTRIPSY     TONSILLECTOMY N/A 12/13/2016   Procedure: TONSILLECTOMY;  Surgeon: Carolee Hunter, MD;  Location: [facility];  Service: ENT;  Laterality: N/A;   ULNAR NERVE TRANSPOSITION Left  10/09/2018   Procedure: SUBCUTANEOUS TRANSPOSITION OF THE ULNAR NERVE OF LEFT ELBOW;  Surgeon: Edie Norleen PARAS, MD;  Location: [facility];  Service: Orthopedics;  Laterality: Left;   WISDOM TOOTH EXTRACTION     Allergies  Allergen Reactions   Alpha-D-Galactosidase Anaphylaxis   Dilaudid [Hydromorphone Hcl] Hives   Hydroxyprogesterone Anaphylaxis   Latex Hives    Gloves (when worn)   Meat Extract Anaphylaxis    Red meat-ALPHAGAL ALLERGY   Lactose Intolerance (Gi) Other (See Comments)    Gi upset   Nexium  [Esomeprazole] Itching   Other Other (See Comments)    Bleach, Neosporin: Rash         10/27/2024   12:09 PM 10/03/2024    9:55 AM 04/26/2022    9:29 AM  Depression screen PHQ 2/9  Decreased Interest 0 0   Down, Depressed, Hopeless 1 1   PHQ - 2 Score 1 1   Altered sleeping 2 1   Tired, decreased energy 0 0   Change in appetite 1 0   Feeling bad or failure about yourself  1 0   Trouble concentrating 2 3   Moving slowly or fidgety/restless 0 0   Suicidal thoughts 0 0   PHQ-9 Score 7 5   Difficult doing work/chores Not difficult at all Somewhat difficult      Information is confidential and restricted. Go to Review Flowsheets to unlock data.       10/27/2024   12:09 PM 10/03/2024    9:56 AM  GAD 7 : Generalized Anxiety Score  Nervous, Anxious, on Edge 1 1  Control/stop worrying 0 0  Worry too much - different things 1 1  Trouble relaxing 1 1  Restless 1 1  Easily annoyed or irritable 2 2  Afraid - awful might happen 0 0  Total GAD 7 Score 6 6  Anxiety Difficulty Not difficult at all Somewhat difficult      Review of Systems  Constitutional:  Negative for chills and fever.  Respiratory:  Negative for shortness of breath.   Cardiovascular:  Negative for chest pain.  Gastrointestinal:  Negative for abdominal pain, constipation, diarrhea, heartburn, nausea and vomiting.  Genitourinary:  Negative for dysuria, frequency and urgency.  Neurological:  Negative for dizziness and headaches.  Endo/Heme/Allergies:  Negative for polydipsia.  Psychiatric/Behavioral:  Negative for depression and suicidal ideas. The patient is not nervous/anxious.       Objective:     BP 116/72   Pulse 84   Temp 98.2 F (36.8 C) (Oral)   Ht 5' 8 (1.727 m)   Wt 221 lb (100.2 kg)   SpO2 98%   BMI 33.60 kg/m  BP Readings from Last 3 Encounters:  10/27/24 116/72  10/03/24 120/82  10/28/21 (!) 124/95   Wt Readings from Last 3 Encounters:  10/27/24 221 lb (100.2 kg)  10/03/24 220 lb  (99.8 kg)  10/27/21 243 lb (110.2 kg)      Physical Exam Vitals and nursing note reviewed.  Constitutional:      Appearance: Normal appearance.  Cardiovascular:     Rate and Rhythm: Normal rate and regular rhythm.     Pulses: Normal pulses.     Heart sounds: Normal heart sounds.  Pulmonary:     Effort: Pulmonary effort is normal.     Breath sounds: Normal breath sounds.  Skin:    General: Skin is warm.  Neurological:     Mental Status: He is alert and oriented to person, place,  and time.  Psychiatric:        Mood and Affect: Mood normal.        Behavior: Behavior normal.        Thought Content: Thought content normal.        Judgment: Judgment normal.      No results found for any visits on 10/27/24.     The ASCVD Risk score (Arnett DK, et al., 2019) failed to calculate for the following reasons:   The 2019 ASCVD risk score is only valid for ages 53 to 62    Assessment & Plan:  Mild intermittent asthma without complication  Gastroesophageal reflux disease, unspecified whether esophagitis present    Assessment and Plan Assessment & Plan Gastroesophageal reflux disease (GERD) GERD symptoms improved with Protonix, no side effects. - Continue Pantoprazole (Protonix) 40 mg orally once daily. - Discontinue Esomeprazole (Nexium). - Provide refills for Protonix as needed.  Asthma Asthma symptoms improved with Symbicort. Sleep disturbance likely due to energy drinks and Symbicort. - Continue Budesonide-formoterol (Symbicort) 80-4.5 MCG/ACT inhalation two puffs twice daily. - Advise discontinuation of energy drinks.  Tobacco use -Smoking cessation instruction/counseling given:  counseled patient on the dangers of tobacco use, advised patient to stop smoking, and reviewed strategies to maximize success   Other specified hearing loss of both ears Awaiting ENT referral, no updates from ENT office. - Follow up with ENT office regarding referral status. - Notify patient  via MyChart once update is received.   Return in about 9 months (around 07/27/2025) for physical and fasting labs. SABRA Carrol Aurora, NP

## 2024-10-27 NOTE — Patient Instructions (Signed)
 Continue Protonix 40 mg once daily.  Continue Symbicort 2 puffs twice daily.   Discontinue energy drinks.   F/u in August for physical and fasting labs.  Please come fasting for four prior to appointment. You are allowed water and black coffee.    It was a pleasure to see you today!

## 2024-10-31 ENCOUNTER — Ambulatory Visit: Admitting: General Practice

## 2024-11-06 ENCOUNTER — Encounter: Payer: Self-pay | Admitting: General Practice

## 2025-08-03 ENCOUNTER — Encounter: Admitting: General Practice
